# Patient Record
Sex: Male | Born: 1960 | Race: White | Hispanic: No | Marital: Married | Smoking: Never smoker
Health system: Southern US, Community
[De-identification: ages and names within clinical notes are randomized; demographics above are authoritative.]

## PROBLEM LIST (undated history)

## (undated) DIAGNOSIS — Z789 Other specified health status: Secondary | ICD-10-CM

## (undated) HISTORY — PX: OTHER SURGICAL HISTORY: SHX169

## (undated) HISTORY — PX: HIP SURGERY: SHX245

---

## 2011-07-05 ENCOUNTER — Emergency Department (HOSPITAL_COMMUNITY)
Admission: EM | Admit: 2011-07-05 | Discharge: 2011-07-06 | Disposition: A | Discharge status: Home / Self Care | Payer: PRIVATE HEALTH INSURANCE | Attending: Emergency Medicine | Admitting: Emergency Medicine

## 2011-07-05 ENCOUNTER — Emergency Department (HOSPITAL_COMMUNITY): Payer: PRIVATE HEALTH INSURANCE

## 2011-07-05 ENCOUNTER — Encounter (HOSPITAL_COMMUNITY): Payer: Self-pay | Admitting: *Deleted

## 2011-07-05 DIAGNOSIS — M79609 Pain in unspecified limb: Secondary | ICD-10-CM | POA: Insufficient documentation

## 2011-07-05 DIAGNOSIS — R0789 Other chest pain: Secondary | ICD-10-CM | POA: Insufficient documentation

## 2011-07-05 LAB — BASIC METABOLIC PANEL
BUN: 10 mg/dL (ref 6–23)
CO2: 23 mEq/L (ref 19–32)
Chloride: 101 mEq/L (ref 96–112)
Creatinine, Ser: 0.83 mg/dL (ref 0.50–1.35)
GFR calc Af Amer: >90 mL/min (ref >90)
Potassium: 3.8 mEq/L (ref 3.5–5.1)

## 2011-07-05 LAB — DIFFERENTIAL
Basophils Absolute: 0 10*3/uL (ref 0.0–0.1)
Basophils Relative: 1 % (ref 0–1)
Lymphocytes Relative: 30 % (ref 12–46)
Monocytes Absolute: 0.5 10*3/uL (ref 0.1–1.0)
Monocytes Relative: 8 % (ref 3–12)
Neutro Abs: 3.2 10*3/uL (ref 1.7–7.7)
Neutrophils Relative %: 56 % (ref 43–77)

## 2011-07-05 LAB — CARDIAC PANEL(CRET KIN+CKTOT+MB+TROPI)
Relative Index: 1.6 (ref 0.0–2.5)
Relative Index: 1.6 (ref 0.0–2.5)
Total CK: 159 U/L (ref 7–232)
Total CK: 155 U/L (ref 7–232)
Troponin I: <0.3 ng/mL (ref <0.30)

## 2011-07-05 LAB — CBC
HCT: 39.2 % (ref 39.0–52.0)
Hemoglobin: 13.5 g/dL (ref 13.0–17.0)
WBC: 5.8 10*3/uL (ref 4.0–10.5)

## 2011-07-05 MED ORDER — ASPIRIN 81 MG PO CHEW
324.0000 mg | CHEWABLE_TABLET | Freq: Once | ORAL | Status: AC
  Administered 2011-07-05: 324 mg via ORAL
  Filled 2011-07-05: qty 4

## 2011-07-05 MED ORDER — GI COCKTAIL ~~LOC~~
30.0000 mL | Freq: Once | ORAL | Status: AC
  Administered 2011-07-05: 30 mL via ORAL
  Filled 2011-07-05: qty 30

## 2011-07-05 MED ORDER — OMEPRAZOLE 20 MG PO CPDR: 20.0000 mg | DELAYED_RELEASE_CAPSULE | Freq: Every day | ORAL | Status: DC

## 2011-07-05 NOTE — ED Notes (Signed)
MD at bedside. 

## 2011-07-05 NOTE — ED Provider Notes (Signed)
History   This chart was scribed for Glynn Octave, MD by Charolett Bumpers . The patient was seen in room APA09/APA09.    CSN: [id number]  Arrival date & time 07/05/11  2123   First MD Initiated Contact with Patient 07/05/11 2132      Chief Complaint  Patient presents with  . Chest Pain    (Consider location/radiation/quality/duration/timing/severity/associated sxs/prior treatment) HPI Austin Bon. is a 51 y.o. male who presents to the Emergency Department complaining of intermittent, moderate chest pain for the past week with it worsening today. Patient states that the pain is located on the left side with associated right arm pain. Patient states that the chest pain is achy, lasting 20-30 minutes and sometimes it lasts 5 minutes. Patient states that nothing specific brings on the pain. Patient denies any nausea, SOB or diaphoresis. Patient states that he has had increased stress recently. Patient denies any radiation into back. Patient denies taking any medications. Patient denies having a PCP. Patient denies any h/o similar symptoms. Patient states that he had a stress test 5 years ago, normal. Patient denies smoking. Nothing makes the symptoms better or worse. Patient states that currently his chest is achy. Patient states that he took an aspirin PTA.    History reviewed. No pertinent past medical history.  Past Surgical History  Procedure Date  . Hip surgery     History reviewed. No pertinent family history.  History  Substance Use Topics  . Smoking status: Never Smoker   . Smokeless tobacco: Not on file  . Alcohol Use: 2.4 oz/week    4 Cans of beer per week     occasional      Review of Systems  Respiratory: Negative for shortness of breath.   Cardiovascular: Positive for chest pain.  Gastrointestinal: Negative for nausea, vomiting and abdominal pain.  Musculoskeletal: Negative for back pain.  Neurological: Negative for headaches.  All other systems  reviewed and are negative.    Allergies  Review of patient's allergies indicates no known allergies.  Home Medications   Current Outpatient Rx  Name Route Sig Dispense Refill  . ASPIRIN EC 81 MG PO TBEC Oral Take 81 mg by mouth once.    . IBUPROFEN 200 MG PO TABS Oral Take 400 mg by mouth once as needed. For pain      BP 131/82  Pulse 78  Temp(Src) 97.6 F (36.4 C) (Oral)  Resp 16  Ht 5' 11.5" (1.816 m)  Wt 206 lb (93.441 kg)  BMI 28.33 kg/m2  SpO2 100%  Physical Exam  Nursing note and vitals reviewed. Constitutional: He is oriented to person, place, and time. He appears well-developed and well-nourished. No distress.  HENT:  Head: Normocephalic and atraumatic.  Eyes: EOM are normal. Pupils are equal, round, and reactive to light.  Neck: Normal range of motion. Neck supple. No tracheal deviation present.  Cardiovascular: Normal rate, regular rhythm and normal heart sounds.   Pulmonary/Chest: Effort normal and breath sounds normal. No respiratory distress.  Abdominal: Soft. He exhibits no distension. There is no tenderness.  Musculoskeletal: Normal range of motion. He exhibits no edema.  Neurological: He is alert and oriented to person, place, and time. No sensory deficit.  Skin: Skin is warm and dry.  Psychiatric: He has a normal mood and affect. His behavior is normal.    ED Course  Procedures (including critical care time)  DIAGNOSTIC STUDIES: Oxygen Saturation is 100% on room air, normal by my interpretation.  COORDINATION OF CARE:  2147: Discussed planned course of treatment with the patient, who is agreeable at this time. Discussed f/u with a PCP for a stress test. Discussed the unlikelihood of MI due to lack of risk factors.  2200: Medication Orders: Aspirin chewable tablet 324 mg-once.  2357: Recheck: Informed patient of lab and imaging results. Discussed f/u with cardiologist. Will check additional labs and will d/c. Patient agreeable at this time.      Labs Reviewed  DIFFERENTIAL - Abnormal; Notable for the following:    Eosinophils Relative 6 (*)    All other components within normal limits  BASIC METABOLIC PANEL - Abnormal; Notable for the following:    Glucose, Bld 109 (*)    All other components within normal limits  CBC  CARDIAC PANEL(CRET KIN+CKTOT+MB+TROPI)   Dg Chest 2 View  07/05/2011  *RADIOLOGY REPORT*  Clinical Data: Chest pain  CHEST - 2 VIEW  Comparison: None.  Findings: Hyperinflation.  Mild interstitial prominence.  The apices are partially obscured by chondral calcification.  Within this limitation, no focal consolidation, pleural effusion, pneumothorax.  Mild vascular fullness. Cardiomediastinal contours otherwise within normal limits.  No acute osseous finding.  IMPRESSION: Hyperinflation without focal consolidation.  Original Report Authenticated By: Waneta Martins, M.D.     No diagnosis found.    MDM  Intermittent chest pains for the past week it seemed to be associated with stress. Sometimes lasting minutes to hours. No pain currently. No shortness of breath, nausea, diaphoresis. No cardiac history.  EKG normal. Troponin negative. Atypical for ACS.  Patient states he knows a cardiologist in [place].  Will also give referral to Dr. Dietrich Pates. TIMI 0. Return precautions discussed.   Date: 07/05/2011  Rate: 66  Rhythm: normal sinus rhythm  QRS Axis: normal  Intervals: normal  ST/T Wave abnormalities: normal  Conduction Disutrbances:none  Narrative Interpretation:   Old EKG Reviewed: none available    I personally performed the services described in this documentation, which was scribed in my presence.  The recorded information has been reviewed and considered.        Glynn Octave, MD 07/06/11 0000

## 2011-07-05 NOTE — ED Notes (Signed)
Pt reports intermitent chest pains starting a week ago.  Reports stress increases pain. Denies SOB or nausea.   No distress noted in triage.

## 2011-07-05 NOTE — Discharge Instructions (Signed)
Chest Pain (Nonspecific) There is no evidence tonight of a heart attack or blood clot in the lung.  However, you need to follow up with your doctor for a stress test.  Return to the ED if you develop new or worsening symptoms. It is often hard to give a specific diagnosis for the cause of chest pain. There is always a chance that your pain could be related to something serious, such as a heart attack or a blood clot in the lungs. You need to follow up with your caregiver for further evaluation. CAUSES   Heartburn.   Pneumonia or bronchitis.   Anxiety or stress.   Inflammation around your heart (pericarditis) or lung (pleuritis or pleurisy).   A blood clot in the lung.   A collapsed lung (pneumothorax). It can develop suddenly on its own (spontaneous pneumothorax) or from injury (trauma) to the chest.   Shingles infection (herpes zoster virus).  The chest wall is composed of bones, muscles, and cartilage. Any of these can be the source of the pain.  The bones can be bruised by injury.   The muscles or cartilage can be strained by coughing or overwork.   The cartilage can be affected by inflammation and become sore (costochondritis).  DIAGNOSIS  Lab tests or other studies, such as X-rays, electrocardiography, stress testing, or cardiac imaging, may be needed to find the cause of your pain.  TREATMENT   Treatment depends on what may be causing your chest pain. Treatment may include:   Acid blockers for heartburn.   Anti-inflammatory medicine.   Pain medicine for inflammatory conditions.   Antibiotics if an infection is present.   You may be advised to change lifestyle habits. This includes stopping smoking and avoiding alcohol, caffeine, and chocolate.   You may be advised to keep your head raised (elevated) when sleeping. This reduces the chance of acid going backward from your stomach into your esophagus.   Most of the time, nonspecific chest pain will improve within 2 to 3  days with rest and mild pain medicine.  HOME CARE INSTRUCTIONS   If antibiotics were prescribed, take your antibiotics as directed. Finish them even if you start to feel better.   For the next few days, avoid physical activities that bring on chest pain. Continue physical activities as directed.   Do not smoke.   Avoid drinking alcohol.   Only take over-the-counter or prescription medicine for pain, discomfort, or fever as directed by your caregiver.   Follow your caregiver's suggestions for further testing if your chest pain does not go away.   Keep any follow-up appointments you made. If you do not go to an appointment, you could develop lasting (chronic) problems with pain. If there is any problem keeping an appointment, you must call to reschedule.  SEEK MEDICAL CARE IF:   You think you are having problems from the medicine you are taking. Read your medicine instructions carefully.   Your chest pain does not go away, even after treatment.   You develop a rash with blisters on your chest.  SEEK IMMEDIATE MEDICAL CARE IF:   You have increased chest pain or pain that spreads to your arm, neck, jaw, back, or abdomen.   You develop shortness of breath, an increasing cough, or you are coughing up blood.   You have severe back or abdominal pain, feel nauseous, or vomit.   You develop severe weakness, fainting, or chills.   You have a fever.  THIS IS AN  EMERGENCY. Do not wait to see if the pain will go away. Get medical help at once. Call your local emergency services (911 in [place] Do not drive yourself to the hospital. MAKE SURE YOU:   Understand these instructions.   Will watch your condition.   Will get help right away if you are not doing well or get worse.  Document Released: 10/24/2004 Document Revised: 01/03/2011 Document Reviewed: 08/20/2007 [facility] Patient Information 2012 [place], [place].

## 2011-07-06 NOTE — ED Notes (Signed)
Pt alert & oriented x4, stable gait. Pt given discharge instructions, paperwork & prescription(s). Patient instructed to stop at the registration desk to finish any additional paperwork. pt verbalized understanding. Pt left department w/ no further questions.  

## 2011-08-06 ENCOUNTER — Encounter (INDEPENDENT_AMBULATORY_CARE_PROVIDER_SITE_OTHER): Payer: Self-pay | Admitting: *Deleted

## 2013-05-20 ENCOUNTER — Encounter (INDEPENDENT_AMBULATORY_CARE_PROVIDER_SITE_OTHER): Payer: Self-pay | Admitting: *Deleted

## 2013-05-26 ENCOUNTER — Other Ambulatory Visit (INDEPENDENT_AMBULATORY_CARE_PROVIDER_SITE_OTHER): Payer: Self-pay | Admitting: *Deleted

## 2013-05-26 ENCOUNTER — Encounter (INDEPENDENT_AMBULATORY_CARE_PROVIDER_SITE_OTHER): Payer: Self-pay | Admitting: *Deleted

## 2013-05-26 ENCOUNTER — Telehealth (INDEPENDENT_AMBULATORY_CARE_PROVIDER_SITE_OTHER): Payer: Self-pay | Admitting: *Deleted

## 2013-05-26 DIAGNOSIS — Z1211 Encounter for screening for malignant neoplasm of colon: Secondary | ICD-10-CM

## 2013-05-26 NOTE — Telephone Encounter (Signed)
Patient needs movi prep 

## 2013-05-28 MED ORDER — PEG-KCL-NACL-NASULF-NA ASC-C 100 G PO SOLR: 1.0000 | Freq: Once | ORAL | Status: DC

## 2013-07-16 ENCOUNTER — Telehealth (INDEPENDENT_AMBULATORY_CARE_PROVIDER_SITE_OTHER): Payer: Self-pay | Admitting: *Deleted

## 2013-07-16 NOTE — Telephone Encounter (Signed)
  Procedure: tcs  Reason/Indication:  screening  Has patient had this procedure before?  no  If so, when, by whom and where?    Is there a family history of colon cancer?  no  Who?  What age when diagnosed?    Is patient diabetic?   no      Does patient have prosthetic heart valve?  no  Do you have a pacemaker?  no  Has patient ever had endocarditis? no  Has patient had joint replacement within last 12 months?  no  Does patient tend to be constipated or take laxatives? no  Is patient on Coumadin, Plavix and/or Aspirin? no  Medications: none  Allergies: nkda  Medication Adjustment:   Procedure date & time: 08/05/13 at 1030

## 2013-07-19 ENCOUNTER — Encounter (HOSPITAL_COMMUNITY): Payer: Self-pay | Admitting: Pharmacy Technician

## 2013-07-20 NOTE — Telephone Encounter (Signed)
agree

## 2013-08-05 ENCOUNTER — Encounter (HOSPITAL_COMMUNITY)
Admission: RE | Disposition: A | Discharge status: Home / Self Care | Payer: Self-pay | Source: Ambulatory Visit | Attending: Internal Medicine

## 2013-08-05 ENCOUNTER — Ambulatory Visit (HOSPITAL_COMMUNITY)
Admission: RE | Admit: 2013-08-05 | Discharge: 2013-08-05 | Disposition: A | Discharge status: Home / Self Care | Payer: PRIVATE HEALTH INSURANCE | Source: Ambulatory Visit | Attending: Internal Medicine | Admitting: Internal Medicine

## 2013-08-05 ENCOUNTER — Encounter (HOSPITAL_COMMUNITY): Payer: Self-pay | Admitting: *Deleted

## 2013-08-05 DIAGNOSIS — D126 Benign neoplasm of colon, unspecified: Secondary | ICD-10-CM | POA: Insufficient documentation

## 2013-08-05 DIAGNOSIS — Z1211 Encounter for screening for malignant neoplasm of colon: Secondary | ICD-10-CM | POA: Insufficient documentation

## 2013-08-05 HISTORY — PX: COLONOSCOPY: SHX5424

## 2013-08-05 HISTORY — PX: POLYPECTOMY: SHX5525

## 2013-08-05 HISTORY — DX: Other specified health status: Z78.9

## 2013-08-05 SURGERY — COLONOSCOPY
Anesthesia: Moderate Sedation

## 2013-08-05 MED ORDER — STERILE WATER FOR IRRIGATION IR SOLN
Status: DC | PRN
  Administered 2013-08-05: 10:00:00

## 2013-08-05 MED ORDER — MIDAZOLAM HCL 5 MG/5ML IJ SOLN
INTRAMUSCULAR | Status: AC
  Filled 2013-08-05: qty 10

## 2013-08-05 MED ORDER — MIDAZOLAM HCL 5 MG/5ML IJ SOLN
INTRAMUSCULAR | Status: DC | PRN
  Administered 2013-08-05 (×4): 2 mg via INTRAVENOUS

## 2013-08-05 MED ORDER — MEPERIDINE HCL 50 MG/ML IJ SOLN
INTRAMUSCULAR | Status: DC | PRN
  Administered 2013-08-05 (×2): 25 mg via INTRAVENOUS

## 2013-08-05 MED ORDER — MEPERIDINE HCL 50 MG/ML IJ SOLN
INTRAMUSCULAR | Status: AC
  Filled 2013-08-05: qty 1

## 2013-08-05 MED ORDER — SODIUM CHLORIDE 0.9 % IV SOLN
INTRAVENOUS | Status: DC
  Administered 2013-08-05: 10:00:00 via INTRAVENOUS

## 2013-08-05 NOTE — Discharge Instructions (Signed)
Resume usual diet. No driving for 24 hours. Physician will call with biopsy results    Colon Polyps Polyps are lumps of extra tissue growing inside the body. Polyps can grow in the large intestine (colon). Most colon polyps are noncancerous (benign). However, some colon polyps can become cancerous over time. Polyps that are larger than a pea may be harmful. To be safe, caregivers remove and test all polyps. CAUSES  Polyps form when mutations in the genes cause your cells to grow and divide even though no more tissue is needed. RISK FACTORS There are a number of risk factors that can increase your chances of getting colon polyps. They include:  Being older than 50 years.  Family history of colon polyps or colon cancer.  Long-term colon diseases, such as colitis or Crohn disease.  Being overweight.  Smoking.  Being inactive.  Drinking too much alcohol. SYMPTOMS  Most small polyps do not cause symptoms. If symptoms are present, they may include:  Blood in the stool. The stool may look dark red or black.  Constipation or diarrhea that lasts longer than 1 week. DIAGNOSIS People often do not know they have polyps until their caregiver finds them during a regular checkup. Your caregiver can use 4 tests to check for polyps:  Digital rectal exam. The caregiver wears gloves and feels inside the rectum. This test would find polyps only in the rectum.  Barium enema. The caregiver puts a liquid called barium into your rectum before taking X-rays of your colon. Barium makes your colon look white. Polyps are dark, so they are easy to see in the X-ray pictures.  Sigmoidoscopy. A thin, flexible tube (sigmoidoscope) is placed into your rectum. The sigmoidoscope has a light and tiny camera in it. The caregiver uses the sigmoidoscope to look at the last third of your colon.  Colonoscopy. This test is like sigmoidoscopy, but the caregiver looks at the entire colon. This is the most common method  for finding and removing polyps. TREATMENT  Any polyps will be removed during a sigmoidoscopy or colonoscopy. The polyps are then tested for cancer. PREVENTION  To help lower your risk of getting more colon polyps:  Eat plenty of fruits and vegetables. Avoid eating fatty foods.  Do not smoke.  Avoid drinking alcohol.  Exercise every day.  Lose weight if recommended by your caregiver.  Eat plenty of calcium and folate. Foods that are rich in calcium include milk, cheese, and broccoli. Foods that are rich in folate include chickpeas, kidney beans, and spinach. HOME CARE INSTRUCTIONS Keep all follow-up appointments as directed by your caregiver. You may need periodic exams to check for polyps. SEEK MEDICAL CARE IF: You notice bleeding during a bowel movement. Document Released: 10/11/2003 Document Revised: 04/08/2011 Document Reviewed: 03/26/2011 [facility] Patient Information 2015 [place], [place]. This information is not intended to replace advice given to you by your health care provider. Make sure you discuss any questions you have with your health care provider. Colonoscopy, Care After These instructions give you information on caring for yourself after your procedure. Your doctor may also give you more specific instructions. Call your doctor if you have any problems or questions after your procedure. HOME CARE  Do not drive for 24 hours.  Do not sign important papers or use machinery for 24 hours.  You may shower.  You may go back to your usual activities, but go slower for the first 24 hours.  Take rest breaks often during the first 24 hours.  Walk around or use warm packs on your belly (abdomen) if you have belly cramping or gas.  Drink enough fluids to keep your pee (urine) clear or pale yellow.  Resume your normal diet. Avoid heavy or fried foods.  Avoid drinking alcohol for 24 hours or as told by your doctor.  Only take medicines as told by your doctor. If a tissue  sample (biopsy) was taken during the procedure:   Do not take aspirin or blood thinners for 7 days, or as told by your doctor.  Do not drink alcohol for 7 days, or as told by your doctor.  Eat soft foods for the first 24 hours. GET HELP IF: You still have a small amount of blood in your poop (stool) 2-3 days after the procedure. GET HELP RIGHT AWAY IF:  You have more than a small amount of blood in your poop.  You see clumps of tissue (blood clots) in your poop.  Your belly is puffy (swollen).  You feel sick to your stomach (nauseous) or throw up (vomit).  You have a fever.  You have belly pain that gets worse and medicine does not help. MAKE SURE YOU:  Understand these instructions.  Will watch your condition.  Will get help right away if you are not doing well or get worse. Document Released: 02/16/2010 Document Revised: 01/19/2013 Document Reviewed: 09/21/2012 [facility] Patient Information 2015 [place], [place]. This information is not intended to replace advice given to you by your health care provider. Make sure you discuss any questions you have with your health care provider.

## 2013-08-05 NOTE — H&P (Signed)
Austin Reeves. is an 53 y.o. male.   Chief Complaint: Patient is here for colonoscopy. HPI: Patient is 53 year old Caucasian male who is here for screening colonoscopy. Austin Reeves denies abdominal pain change in bowel habits or rectal bleeding. Family history is negative for CRC.  Past Medical History  Diagnosis Date  . Medical history non-contributory     Past Surgical History  Procedure Laterality Date  . Hip surgery    . Urinary stent      Family History  Problem Relation Age of Onset  . Lung cancer Father   . Colon cancer Neg Hx    Social History:  reports that Austin Reeves has never smoked. Austin Reeves does not have any smokeless tobacco history on file. Austin Reeves reports that Austin Reeves drinks about 2.4 ounces of alcohol per week. Austin Reeves reports that Austin Reeves does not use illicit drugs.  Allergies: No Known Allergies  No prescriptions prior to admission    No results found for this or any previous visit (from the past 48 hour(s)). No results found.  ROS  Blood pressure 106/45, pulse 63, temperature 97.4 F (36.3 C), temperature source Oral, resp. rate 18, height 6' (1.829 m), weight 192 lb (87.091 kg), SpO2 100.00%. Physical Exam  Constitutional: Austin Reeves is oriented to person, place, and time. Austin Reeves appears well-developed and well-nourished.  HENT:  Mouth/Throat: Oropharynx is clear and moist.  Eyes: Conjunctivae are normal. No scleral icterus.  Neck: No thyromegaly present.  Cardiovascular: Normal rate, regular rhythm and normal heart sounds.   No murmur heard. Respiratory: Effort normal and breath sounds normal.  GI: Soft. Bowel sounds are normal. Austin Reeves exhibits no distension and no mass. There is no tenderness.  Musculoskeletal: Austin Reeves exhibits no edema.  Lymphadenopathy:    Austin Reeves has no cervical adenopathy.  Neurological: Austin Reeves is alert and oriented to person, place, and time.  Skin: Skin is warm and dry.     Assessment/Plan Average risk screening colonoscopy.  Aikam Hellickson U 08/05/2013, 10:07 AM

## 2013-08-05 NOTE — Op Note (Addendum)
COLONOSCOPY PROCEDURE REPORT  PATIENT:  Austin Reeves.  MR#:  [id number] Birthdate:  1960-06-05, 53 y.o., male Endoscopist:  Dr. Rogene Houston, MD Referred By:  Dr. Ames Dura, MD Procedure Date: 08/05/2013  Procedure:   Colonoscopy  Indications: Patient is 53 year old Caucasian male who is undergoing average risk screening colonoscopy.  Informed Consent:  The procedure and risks were reviewed with the patient and informed consent was obtained.  Medications:  Demerol 50 mg IV Versed 8 mg IV  Description of procedure:  After a digital rectal exam was performed, that colonoscope was advanced from the anus through the rectum and colon to the area of the cecum, ileocecal valve and appendiceal orifice. The cecum was deeply intubated. These structures were well-seen and photographed for the record. From the level of the cecum and ileocecal valve, the scope was slowly and cautiously withdrawn. The mucosal surfaces were carefully surveyed utilizing scope tip to flexion to facilitate fold flattening as needed. The scope was pulled down into the rectum where a thorough exam including retroflexion was performed.  Findings:   Prep satisfactory. Three small polyps ablated via cold biopsy(2) and cold snare(1) and submitted together. Two were located at splenic flexure and one at sigmoid colon. Mucosa of rest  of the colon and rectum was normal. Anorectal junction was unremarkable.   Therapeutic/Diagnostic Maneuvers Performed:  See above  Complications:  None  Cecal Withdrawal Time:  12 minutes  Impression:  Examination performed to cecum. Three small polyps ablated via cold biopsy(2) and cold snare(1) and submitted together. Two were located at splenic flexure and one at sigmoid colon.  Recommendations:  Standard instructions given. I will contact patient with biopsy results and further recommendations.  REHMAN,NAJEEB U  08/05/2013 10:50 AM  CC: Dr. Ames Dura, MD & Dr. Rayne Du  ref. provider found

## 2013-08-09 ENCOUNTER — Encounter (HOSPITAL_COMMUNITY): Payer: Self-pay | Admitting: Internal Medicine

## 2013-08-10 ENCOUNTER — Encounter (INDEPENDENT_AMBULATORY_CARE_PROVIDER_SITE_OTHER): Payer: Self-pay | Admitting: *Deleted

## 2017-06-13 ENCOUNTER — Encounter: Payer: Self-pay | Admitting: Family Medicine

## 2017-06-13 ENCOUNTER — Ambulatory Visit (INDEPENDENT_AMBULATORY_CARE_PROVIDER_SITE_OTHER): Payer: 59 | Admitting: Family Medicine

## 2017-06-13 ENCOUNTER — Other Ambulatory Visit: Payer: Self-pay

## 2017-06-13 VITALS — BP 116/70 | HR 61 | Temp 98.5°F | Resp 16 | Ht 72.0 in | Wt 205.1 lb

## 2017-06-13 DIAGNOSIS — L989 Disorder of the skin and subcutaneous tissue, unspecified: Secondary | ICD-10-CM | POA: Diagnosis not present

## 2017-06-13 DIAGNOSIS — Z0000 Encounter for general adult medical examination without abnormal findings: Secondary | ICD-10-CM | POA: Diagnosis not present

## 2017-06-13 DIAGNOSIS — E785 Hyperlipidemia, unspecified: Secondary | ICD-10-CM

## 2017-06-13 DIAGNOSIS — M24542 Contracture, left hand: Secondary | ICD-10-CM

## 2017-06-13 DIAGNOSIS — Z23 Encounter for immunization: Secondary | ICD-10-CM | POA: Diagnosis not present

## 2017-06-13 DIAGNOSIS — R739 Hyperglycemia, unspecified: Secondary | ICD-10-CM

## 2017-06-13 DIAGNOSIS — R5382 Chronic fatigue, unspecified: Secondary | ICD-10-CM | POA: Diagnosis not present

## 2017-06-13 DIAGNOSIS — R5383 Other fatigue: Secondary | ICD-10-CM | POA: Diagnosis not present

## 2017-06-13 DIAGNOSIS — Z114 Encounter for screening for human immunodeficiency virus [HIV]: Secondary | ICD-10-CM

## 2017-06-13 DIAGNOSIS — Z1159 Encounter for screening for other viral diseases: Secondary | ICD-10-CM | POA: Diagnosis not present

## 2017-06-13 NOTE — Patient Instructions (Signed)
Shingrix Vaccine-check with [facility].
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
## 2017-06-18 ENCOUNTER — Encounter: Payer: Self-pay | Admitting: Family Medicine

## 2017-06-18 LAB — CBC WITH DIFFERENTIAL/PLATELET
Basophils Absolute: 73 cells/uL (ref 0–200)
Basophils Relative: 1.1 %
EOS PCT: 7.3 %
Eosinophils Absolute: 482 cells/uL (ref 15–500)
HCT: 40.8 % (ref 38.5–50.0)
Hemoglobin: 14 g/dL (ref 13.2–17.1)
Lymphs Abs: 1386 cells/uL (ref 850–3900)
MCH: 30.5 pg (ref 27.0–33.0)
MCHC: 34.3 g/dL (ref 32.0–36.0)
MCV: 88.9 fL (ref 80.0–100.0)
MPV: 10.8 fL (ref 7.5–12.5)
Monocytes Relative: 7.3 %
Neutro Abs: 4178 cells/uL (ref 1500–7800)
Neutrophils Relative %: 63.3 %
PLATELETS: 253 10*3/uL (ref 140–400)
RBC: 4.59 10*6/uL (ref 4.20–5.80)
RDW: 12.5 % (ref 11.0–15.0)
TOTAL LYMPHOCYTE: 21 %
WBC mixed population: 482 cells/uL (ref 200–950)
WBC: 6.6 10*3/uL (ref 3.8–10.8)

## 2017-06-18 LAB — HEMOGLOBIN A1C
Hgb A1c MFr Bld: 5.8 % of total Hgb — ABNORMAL HIGH (ref <5.7)
Mean Plasma Glucose: 120 (calc)
eAG (mmol/L): 6.6 (calc)

## 2017-06-18 LAB — LIPID PANEL
CHOLESTEROL: 228 mg/dL — AB (ref <200)
HDL: 53 mg/dL (ref >40)
LDL Cholesterol (Calc): 153 mg/dL (calc) — ABNORMAL HIGH
Non-HDL Cholesterol (Calc): 175 mg/dL (calc) — ABNORMAL HIGH (ref <130)
Total CHOL/HDL Ratio: 4.3 (calc) (ref <5.0)
Triglycerides: 108 mg/dL (ref <150)

## 2017-06-18 LAB — TEST AUTHORIZATION

## 2017-06-18 LAB — COMPLETE METABOLIC PANEL WITH GFR
AG RATIO: 2.2 (calc) (ref 1.0–2.5)
ALKALINE PHOSPHATASE (APISO): 93 U/L (ref 40–115)
ALT: 15 U/L (ref 9–46)
AST: 15 U/L (ref 10–35)
Albumin: 5 g/dL (ref 3.6–5.1)
BUN: 17 mg/dL (ref 7–25)
CO2: 27 mmol/L (ref 20–32)
Calcium: 10 mg/dL (ref 8.6–10.3)
Chloride: 104 mmol/L (ref 98–110)
Creat: 0.9 mg/dL (ref 0.70–1.33)
GFR, Est African American: 111 mL/min/{1.73_m2} (ref >=60)
GFR, Est Non African American: 96 mL/min/{1.73_m2} (ref >=60)
GLOBULIN: 2.3 g/dL (ref 1.9–3.7)
Glucose, Bld: 108 mg/dL — ABNORMAL HIGH (ref 65–99)
Potassium: 4.7 mmol/L (ref 3.5–5.3)
SODIUM: 139 mmol/L (ref 135–146)
Total Bilirubin: 1.1 mg/dL (ref 0.2–1.2)
Total Protein: 7.3 g/dL (ref 6.1–8.1)

## 2017-06-18 LAB — HIV ANTIBODY (ROUTINE TESTING W REFLEX): HIV 1&2 Ab, 4th Generation: NONREACTIVE

## 2017-06-18 LAB — HEPATITIS C ANTIBODY
Hepatitis C Ab: NONREACTIVE
SIGNAL TO CUT-OFF: 0.02 (ref <1.00)

## 2017-06-18 NOTE — Progress Notes (Signed)
Spoke with Tami Ribas at [facility]. It looks like an HIV test was keyed in instead of the Lipid panel that was ordered. She added the lipid panel and it should be ran today.

## 2017-07-02 ENCOUNTER — Telehealth: Payer: Self-pay | Admitting: Family Medicine

## 2017-07-02 ENCOUNTER — Ambulatory Visit (INDEPENDENT_AMBULATORY_CARE_PROVIDER_SITE_OTHER): Payer: Self-pay | Admitting: Orthopaedic Surgery

## 2017-07-02 ENCOUNTER — Encounter (INDEPENDENT_AMBULATORY_CARE_PROVIDER_SITE_OTHER): Payer: Self-pay | Admitting: Orthopaedic Surgery

## 2017-07-02 VITALS — BP 133/77 | HR 49 | Ht 72.0 in | Wt 205.0 lb

## 2017-07-02 DIAGNOSIS — M79642 Pain in left hand: Secondary | ICD-10-CM

## 2017-07-02 NOTE — Telephone Encounter (Signed)
Cholesterol <200 mg/dL 228High    HDL >40 mg/dL 53   Triglycerides <150 mg/dL 108   LDL Cholesterol (Calc) mg/dL (calc) 153High      Please call and give patient the above lab values.  In addition, please send him them in the mail.  I do not know why his cholesterol values did not get on the lab letter.  He does need to follow-up to discuss his cholesterol.

## 2017-07-02 NOTE — Telephone Encounter (Signed)
Labs mailed per patient request
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
## 2017-07-03 ENCOUNTER — Encounter: Payer: Self-pay | Admitting: Family Medicine

## 2017-07-04 ENCOUNTER — Encounter: Payer: Self-pay | Admitting: Family Medicine

## 2018-06-19 DIAGNOSIS — R7303 Prediabetes: Secondary | ICD-10-CM | POA: Diagnosis not present

## 2018-06-19 DIAGNOSIS — Z0000 Encounter for general adult medical examination without abnormal findings: Secondary | ICD-10-CM | POA: Diagnosis not present

## 2018-06-19 DIAGNOSIS — Z13220 Encounter for screening for lipoid disorders: Secondary | ICD-10-CM | POA: Diagnosis not present

## 2018-06-19 DIAGNOSIS — Z125 Encounter for screening for malignant neoplasm of prostate: Secondary | ICD-10-CM | POA: Diagnosis not present

## 2018-07-14 DIAGNOSIS — L570 Actinic keratosis: Secondary | ICD-10-CM | POA: Diagnosis not present

## 2018-07-14 DIAGNOSIS — L819 Disorder of pigmentation, unspecified: Secondary | ICD-10-CM | POA: Diagnosis not present

## 2018-07-14 DIAGNOSIS — D485 Neoplasm of uncertain behavior of skin: Secondary | ICD-10-CM | POA: Diagnosis not present

## 2018-07-14 DIAGNOSIS — D1801 Hemangioma of skin and subcutaneous tissue: Secondary | ICD-10-CM | POA: Diagnosis not present

## 2018-07-14 DIAGNOSIS — L821 Other seborrheic keratosis: Secondary | ICD-10-CM | POA: Diagnosis not present

## 2019-01-04 DIAGNOSIS — R05 Cough: Secondary | ICD-10-CM | POA: Diagnosis not present

## 2019-01-05 ENCOUNTER — Other Ambulatory Visit: Payer: Self-pay

## 2019-01-05 ENCOUNTER — Other Ambulatory Visit (HOSPITAL_COMMUNITY): Payer: Self-pay | Admitting: Family Medicine

## 2019-01-05 ENCOUNTER — Ambulatory Visit (HOSPITAL_COMMUNITY)
Admission: RE | Admit: 2019-01-05 | Discharge: 2019-01-05 | Disposition: A | Discharge status: Home / Self Care | Payer: BC Managed Care – PPO | Source: Ambulatory Visit | Attending: Family Medicine | Admitting: Family Medicine

## 2019-01-05 DIAGNOSIS — R05 Cough: Secondary | ICD-10-CM | POA: Diagnosis not present

## 2019-01-05 DIAGNOSIS — R059 Cough, unspecified: Secondary | ICD-10-CM

## 2020-06-27 IMAGING — DX DG CHEST 2V
3 series · 3 of 3 positions shown · non-contrast
Comparison: Chest x-ray 07/05/2011.

CLINICAL DATA: 58-year-old male recently diagnosed with [id number].
Mild cough.

EXAM:
CHEST - 2 VIEW

[chest pa (1 of 2)]
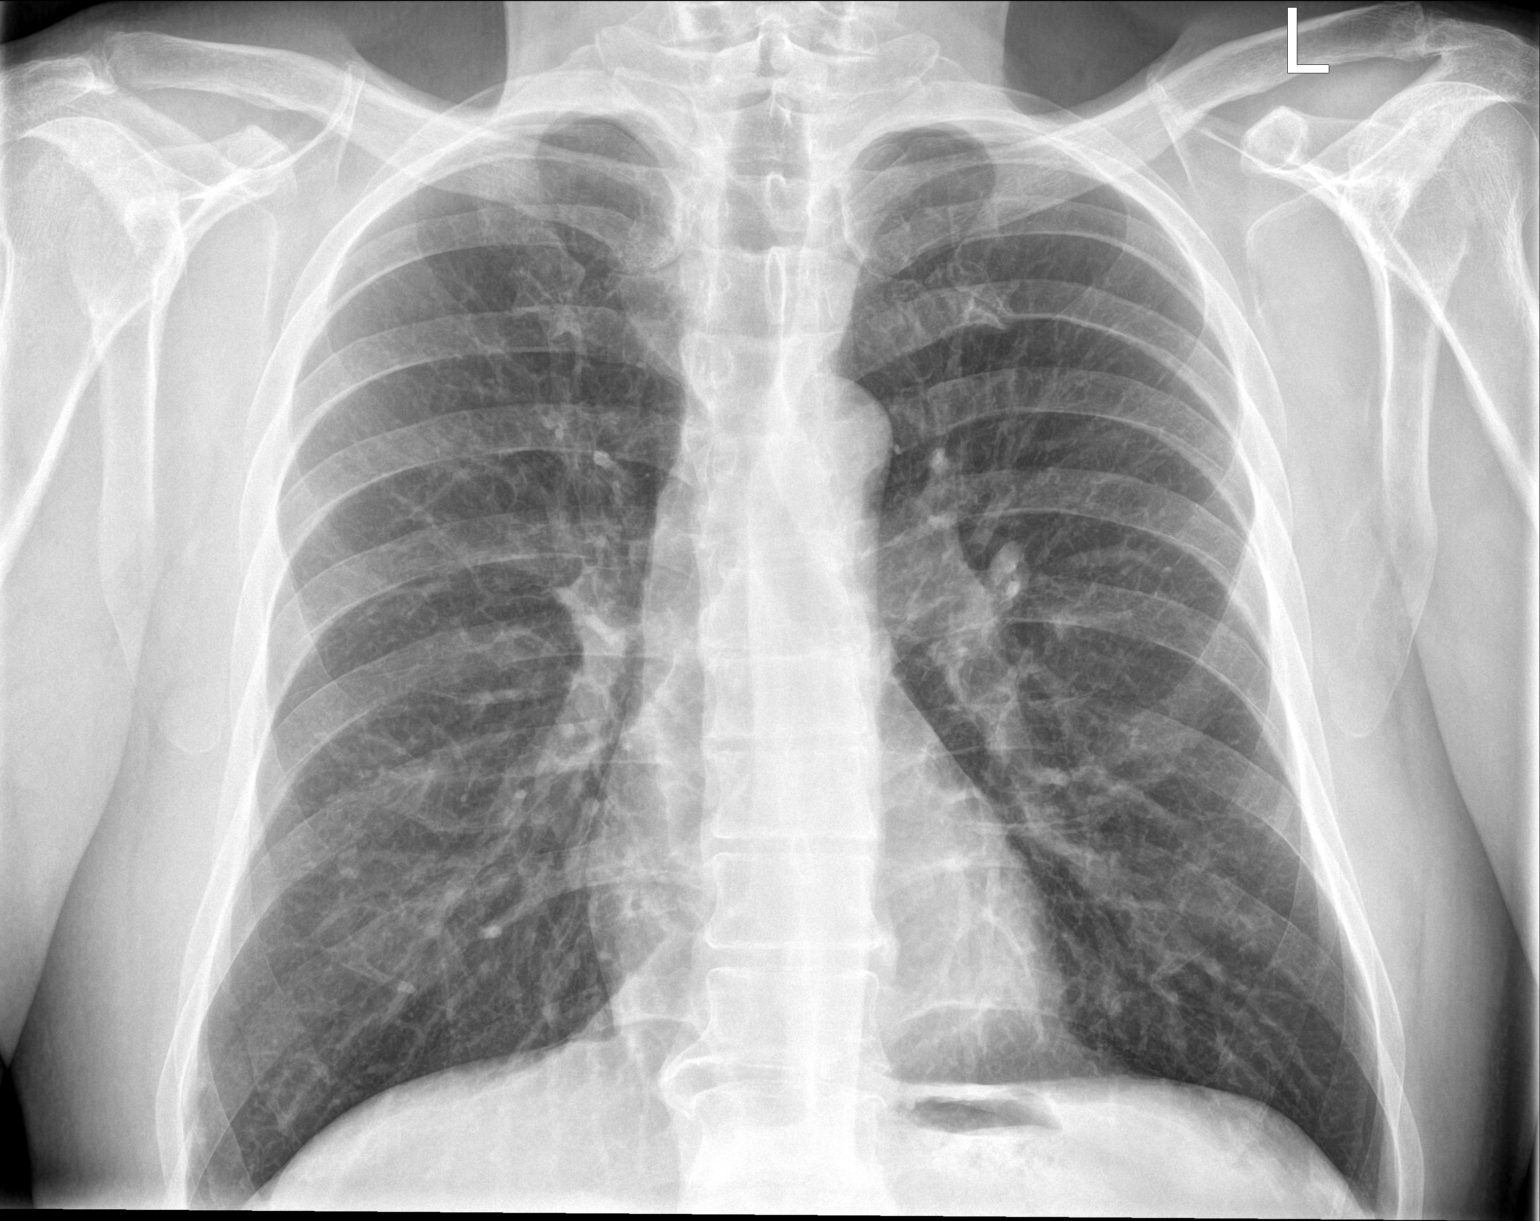

[chest lat]
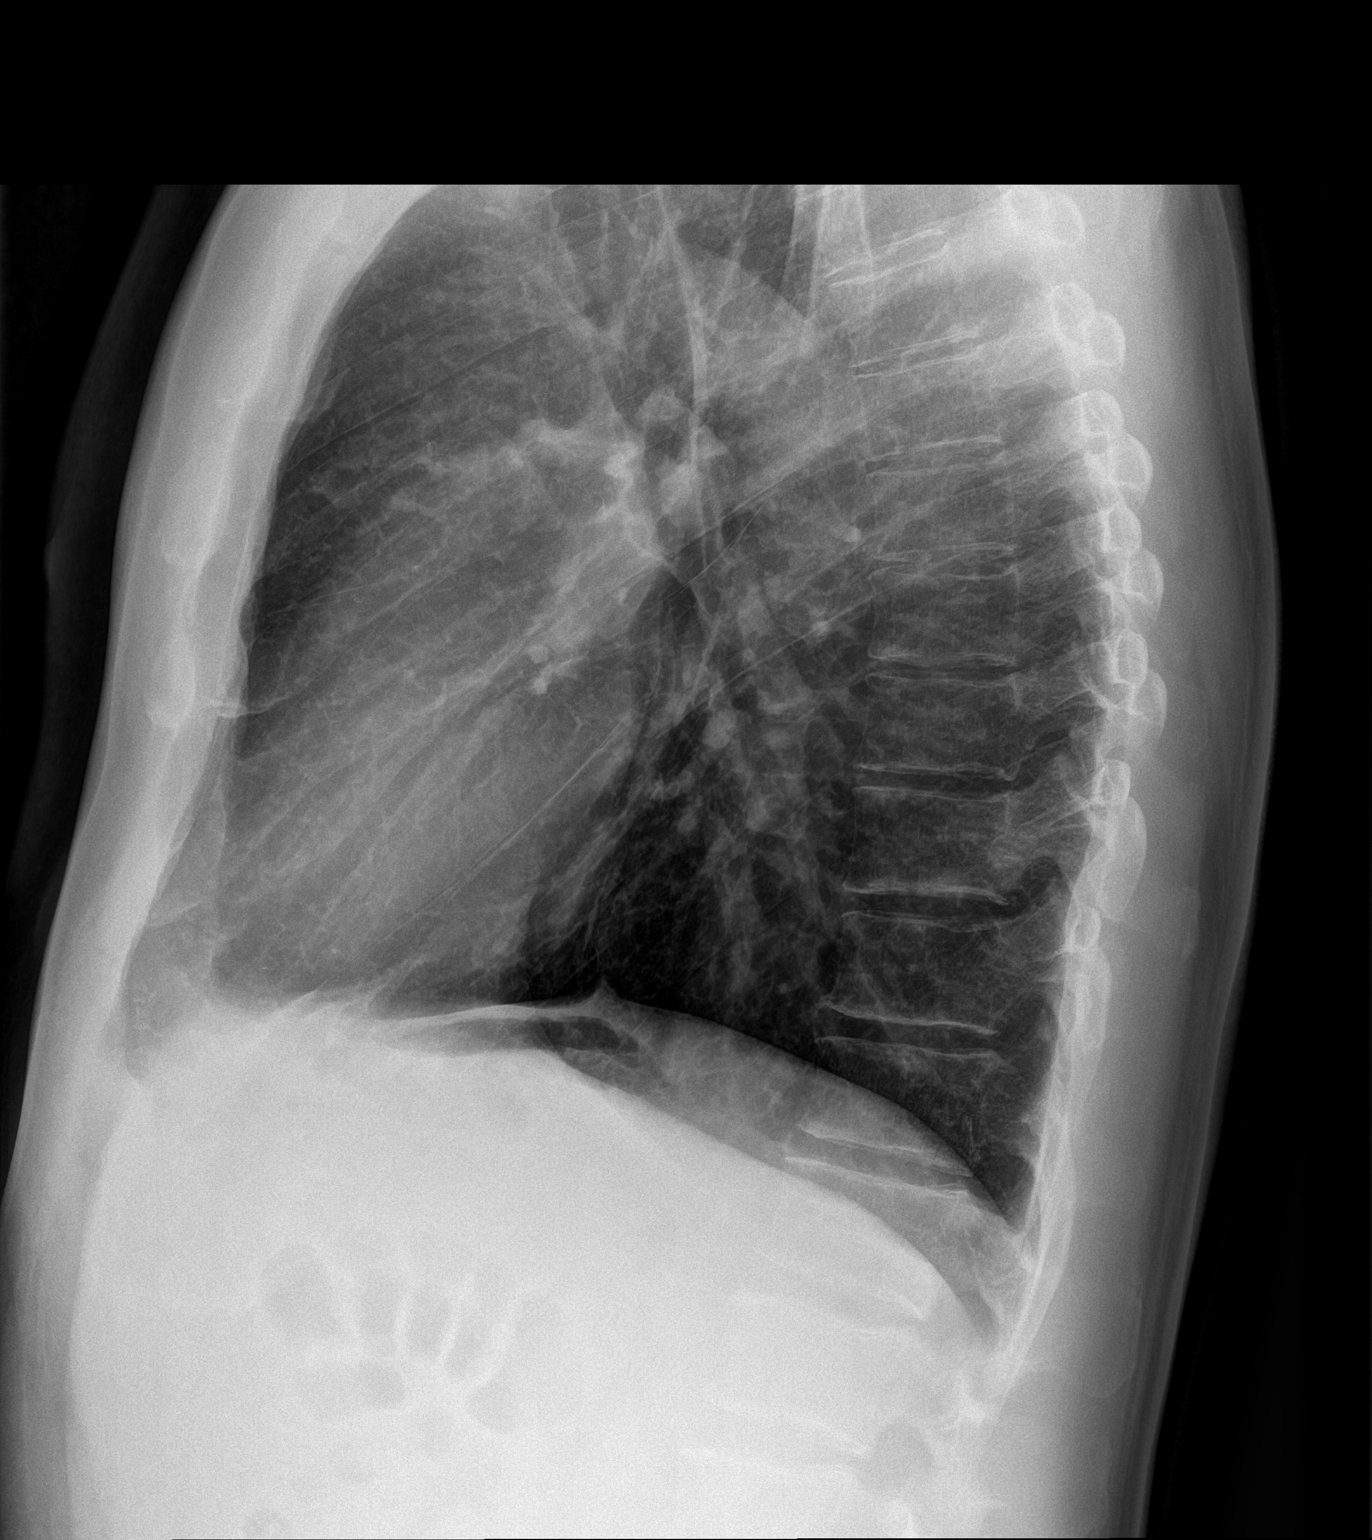

[chest pa (2 of 2)]
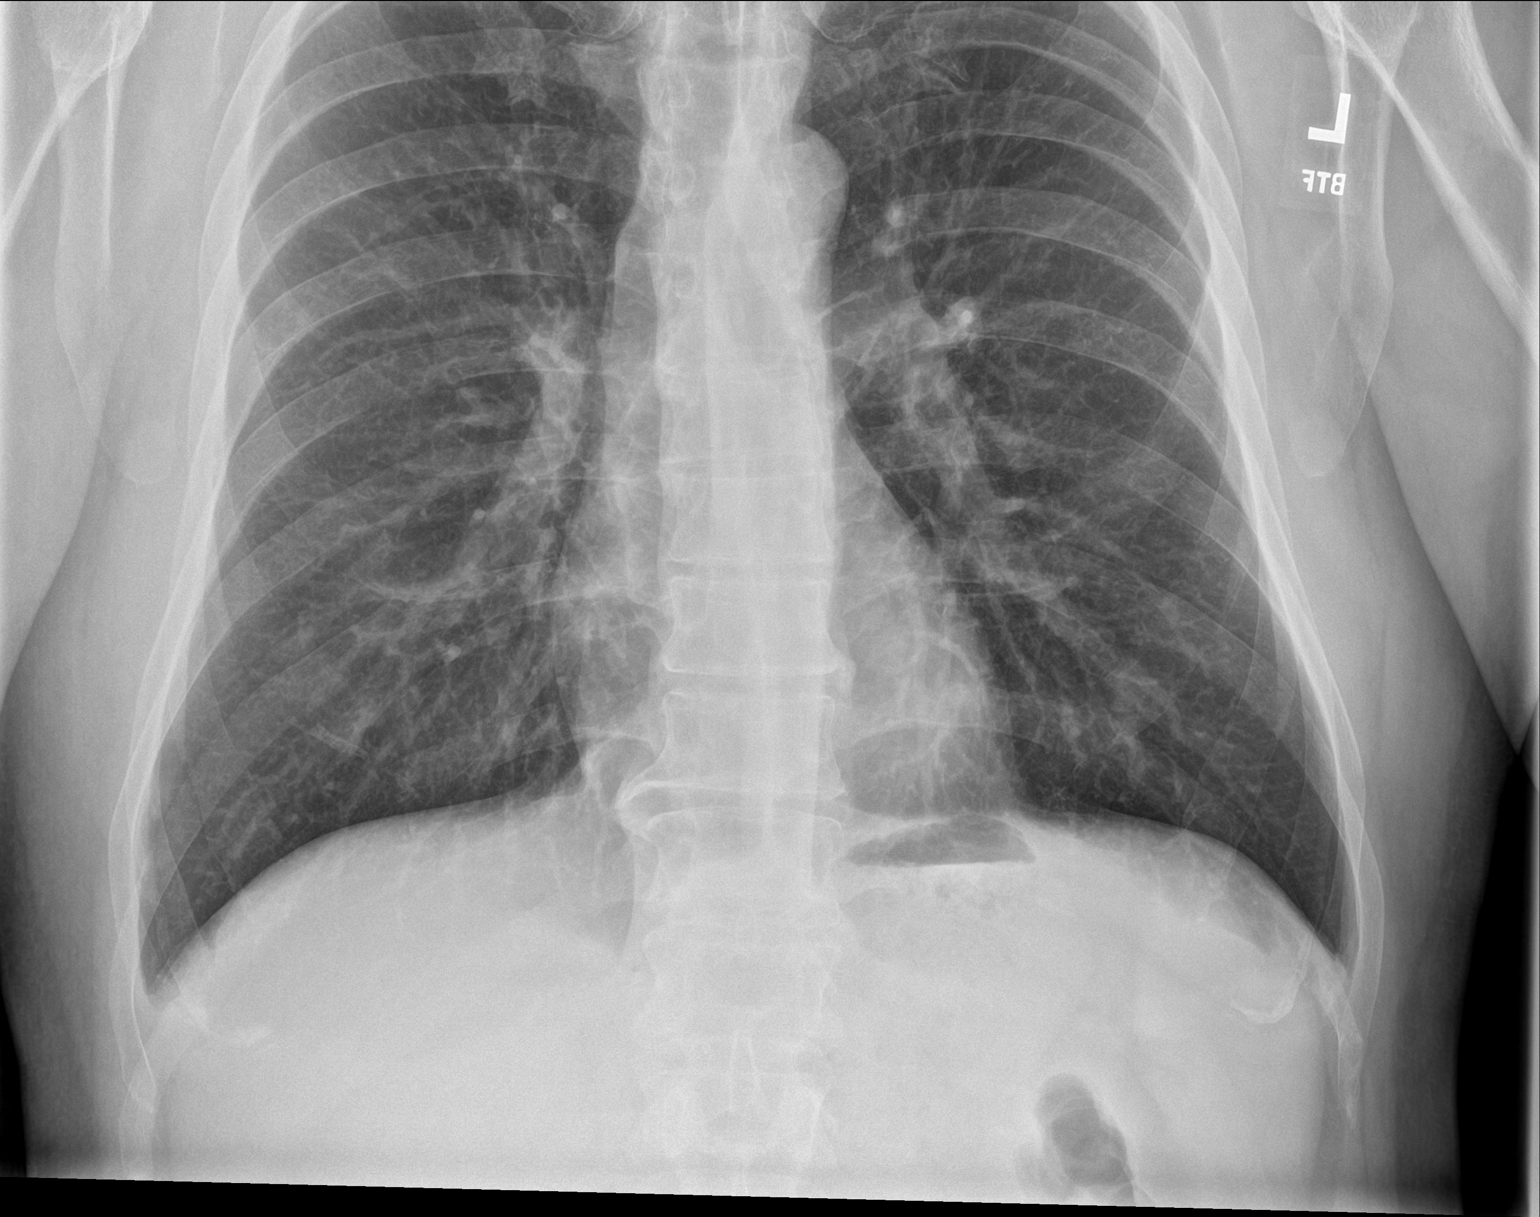

[3 of 3 positions shown; findings below may reference images not displayed]

FINDINGS: Lung volumes are normal. No consolidative airspace disease. No
pleural effusions. No pneumothorax. No pulmonary nodule or mass
noted. Pulmonary vasculature and the cardiomediastinal silhouette
are within normal limits.
IMPRESSION: No radiographic evidence of acute cardiopulmonary disease.

## 2020-07-25 ENCOUNTER — Encounter (INDEPENDENT_AMBULATORY_CARE_PROVIDER_SITE_OTHER): Payer: Self-pay | Admitting: *Deleted

## 2021-01-25 ENCOUNTER — Encounter (HOSPITAL_BASED_OUTPATIENT_CLINIC_OR_DEPARTMENT_OTHER): Payer: Self-pay | Admitting: Nurse Practitioner

## 2021-01-25 ENCOUNTER — Other Ambulatory Visit: Payer: Self-pay

## 2021-01-25 ENCOUNTER — Ambulatory Visit (INDEPENDENT_AMBULATORY_CARE_PROVIDER_SITE_OTHER): Payer: BC Managed Care – PPO | Admitting: Nurse Practitioner

## 2021-01-25 VITALS — BP 122/72 | HR 81 | Ht 72.0 in | Wt 204.5 lb

## 2021-01-25 DIAGNOSIS — Z0000 Encounter for general adult medical examination without abnormal findings: Secondary | ICD-10-CM

## 2021-01-25 DIAGNOSIS — Z130 Encounter for screening for diseases of the blood and blood-forming organs and certain disorders involving the immune mechanism: Secondary | ICD-10-CM

## 2021-01-25 DIAGNOSIS — M720 Palmar fascial fibromatosis [Dupuytren]: Secondary | ICD-10-CM

## 2021-01-25 DIAGNOSIS — R5383 Other fatigue: Secondary | ICD-10-CM | POA: Insufficient documentation

## 2021-01-25 DIAGNOSIS — Z13220 Encounter for screening for lipoid disorders: Secondary | ICD-10-CM

## 2021-01-25 DIAGNOSIS — J309 Allergic rhinitis, unspecified: Secondary | ICD-10-CM | POA: Insufficient documentation

## 2021-01-25 DIAGNOSIS — Z1329 Encounter for screening for other suspected endocrine disorder: Secondary | ICD-10-CM | POA: Diagnosis not present

## 2021-01-25 DIAGNOSIS — Z87898 Personal history of other specified conditions: Secondary | ICD-10-CM | POA: Insufficient documentation

## 2021-01-25 DIAGNOSIS — Z13228 Encounter for screening for other metabolic disorders: Secondary | ICD-10-CM

## 2021-01-25 DIAGNOSIS — Z1321 Encounter for screening for nutritional disorder: Secondary | ICD-10-CM

## 2021-01-25 DIAGNOSIS — J0140 Acute pansinusitis, unspecified: Secondary | ICD-10-CM | POA: Insufficient documentation

## 2021-01-25 MED ORDER — AMOXICILLIN 875 MG PO TABS: 875.0000 mg | ORAL_TABLET | Freq: Two times a day (BID) | ORAL | 0 refills | Status: AC

## 2021-01-25 NOTE — Assessment & Plan Note (Signed)
Series of intermittent palpitations for 1 week approximately one month ago without recurrence.  No symptoms present today No alarm symptoms present.  HRR with no signs of ectopy or irregular beats. No HTN or HLD history.  Unclear etiology at this time, however, encouraged patient to seek evaluation immediately if these symptoms return.  We will obtain labs today for further evaluation of possible electrolyte imbalance or other findings that could be causative.  If reocurrs, will plan for EKG and Zio patch monitoring with cardiology referral for evaluation.

## 2021-01-25 NOTE — Assessment & Plan Note (Signed)
Symptoms consistent with bacterial sinusitis given time present.  He has responded well to amoxicillin in the past, therefore we will begin treatment today with this.  Recommend continued use of OTC medications to help manage symptoms as needed.  If symptoms worsen or persist despite treatment, recommend he contact us for f/u

## 2021-01-25 NOTE — Patient Instructions (Addendum)
Thank you for choosing [place] at [facility] for your Primary Care needs. I am excited for the opportunity to partner with you to meet your health care goals. It was a pleasure meeting you today!  Recommendations from today's visit: Wishing you many blessings and health in the new year! We will get some labs today to make sure everything looks ok.  I would like you to get your flu vaccine, but wait until you feel better from your infection.  I have sent amoxicillin to the pharmacy for you- if you do not feel better after completing this, please let us know.  If I find any additional information on expectation of surgery for  Dupuytren Contraction I will let you know.   Information on diet, exercise, and health maintenance recommendations are listed below. This is information to help you be sure you are on track for optimal health and monitoring.   Please look over this and let us know if you have any questions or if you have completed any of the health maintenance outside of [place] so that we can be sure your records are up to date.  ___________________________________________________________ About Me: I am an Adult-Geriatric Nurse Practitioner with a background in caring for patients for more than 20 years with a strong intensive care background. I provide primary care and sports medicine services to patients age 86 and older within this office. My education had a strong focus on caring for the older adult population, which I am passionate about. I am also the director of the APP Fellowship with [facility].   My desire is to provide you with the best service through preventive medicine and supportive care. I consider you a part of the medical team and value your input. I work diligently to ensure that you are heard and your needs are met in a safe and effective manner. I want you to feel comfortable with me as your provider and want you to know that your health  concerns are important to me.  For your information, our office hours are: Monday, Tuesday, and Thursday 8:00 AM - 5:00 PM Wednesday and Friday 8:00 AM - 12:00 PM.   In my time away from the office I am teaching new APP's within the system and am unavailable, but my partner, Dr. Burnard Bunting is in the office for emergent needs.   If you have questions or concerns, please call our office at [phone] or send [place] a MyChart message and we will respond as quickly as possible.  ____________________________________________________________ MyChart:  For all urgent or time sensitive needs we ask that you please call the office to avoid delays. Our number is [phone]. MyChart is not constantly monitored and due to the large volume of messages a day, replies may take up to 72 business hours.  MyChart Policy: MyChart allows for you to see your visit notes, after visit summary, provider recommendations, lab and tests results, make an appointment, request refills, and contact your provider or the office for non-urgent questions or concerns. Providers are seeing patients during normal business hours and do not have built in time to review MyChart messages.  We ask that you allow a minimum of 3 business days for responses to [employer]. For this reason, please do not send urgent requests through [place]. Please call the office at [phone]. New and ongoing conditions may require a visit. We have virtual and in person visit available for your convenience.  Complex MyChart concerns may require  a visit. Your provider may request you schedule a virtual or in person visit to ensure we are providing the best care possible. MyChart messages sent after 11:00 AM on Friday will not be received by the provider until Monday morning.    Lab and Test Results: You will receive your lab and test results on MyChart as soon as they are completed and results have been sent by the lab or testing facility. Due to this  service, you will receive your results BEFORE your provider.  I review lab and tests results each morning prior to seeing patients. Some results require collaboration with other providers to ensure you are receiving the most appropriate care. For this reason, we ask that you please allow a minimum of 3-5 business days from the time the ALL results have been received for your provider to receive and review lab and test results and contact you about these.  Most lab and test result comments from the provider will be sent through [place]. Your provider may recommend changes to the plan of care, follow-up visits, repeat testing, ask questions, or request an office visit to discuss these results. You may reply directly to this message or call the office at [phone] to provide information for the provider or set up an appointment. In some instances, you will be called with test results and recommendations. Please let us know if this is preferred and we will make note of this in your chart to provide this for you.    If you have not heard a response to your lab or test results in 5 business days from all results returning to [place], please call the office to let us know. We ask that you please avoid calling prior to this time unless there is an emergent concern. Due to high call volumes, this can delay the resulting process.  After Hours: For all non-emergency after hours needs, please call the office at [phone] and select the option to reach the on-call provider service. On-call services are shared between multiple [facility] offices and therefore it will not be possible to speak directly with your provider. On-call providers may provide medical advice and recommendations, but are unable to provide refills for maintenance medications.  For all emergency or urgent medical needs after normal business hours, we recommend that you seek care at the closest Urgent Care or Emergency Department to ensure  appropriate treatment in a timely manner.  [facility] has a 24 hour emergency room located on the ground floor for your convenience.   Urgent Concerns During the Business Day Providers are seeing patients from 8AM to [place] with a busy schedule and are most often not able to respond to non-urgent calls until the end of the day or the next business day. If you should have URGENT concerns during the day, please call and speak to the nurse or schedule a same day appointment so that we can address your concern without delay.   Thank you, again, for choosing me as your health care partner. I appreciate your trust and look forward to learning more about you.   Worthy Keeler, DNP, AGNP-c ___________________________________________________________  Health Maintenance Recommendations Screening Testing Mammogram Every 1 -2 years based on history and risk factors Starting at age 60 Pap Smear Ages 21-39 every 3 years Ages 52-65 every 5 years with HPV testing More frequent testing may be required based on results and history Colon Cancer Screening Every 1-10 years based on test performed, risk factors,  and history Starting at age 71 Bone Density Screening Every 2-10 years based on history Starting at age 66 for women Recommendations for men differ based on medication usage, history, and risk factors AAA Screening One time ultrasound Men 27-66 years old who have every smoked Lung Cancer Screening Low Dose Lung CT every 12 months Age 68-80 years with a 30 pack-year smoking history who still smoke or who have quit within the last 15 years  Screening Labs Routine  Labs: Complete Blood Count (CBC), Complete Metabolic Panel (CMP), Cholesterol (Lipid Panel) Every 6-12 months based on history and medications May be recommended more frequently based on current conditions or previous results Hemoglobin A1c Lab Every 3-12 months based on history and previous results Starting at  age 88 or earlier with diagnosis of diabetes, high cholesterol, BMI >26, and/or risk factors Frequent monitoring for patients with diabetes to ensure blood sugar control Thyroid Panel (TSH w/ T3 & T4) Every 6 months based on history, symptoms, and risk factors May be repeated more often if on medication HIV One time testing for all patients 77 and older May be repeated more frequently for patients with increased risk factors or exposure Hepatitis C One time testing for all patients 22 and older May be repeated more frequently for patients with increased risk factors or exposure Gonorrhea, Chlamydia Every 12 months for all sexually active persons 13-24 years Additional monitoring may be recommended for those who are considered high risk or who have symptoms PSA Men 13-23 years old with risk factors Additional screening may be recommended from age 81-69 based on risk factors, symptoms, and history  Vaccine Recommendations Tetanus Booster All adults every 10 years Flu Vaccine All patients 6 months and older every year COVID Vaccine All patients 12 years and older Initial dosing with booster May recommend additional booster based on age and health history HPV Vaccine 2 doses all patients age 3-26 Dosing may be considered for patients over 26 Shingles Vaccine (Shingrix) 2 doses all adults 64 years and older Pneumonia (Pneumovax 23) All adults 55 years and older May recommend earlier dosing based on health history Pneumonia (Prevnar 46) All adults 26 years and older Dosed 1 year after Pneumovax 23  Additional Screening, Testing, and Vaccinations may be recommended on an individualized basis based on family history, health history, risk factors, and/or exposure.  __________________________________________________________  Diet Recommendations for All Patients  I recommend that all patients maintain a diet low in saturated fats, carbohydrates, and cholesterol. While this can be  challenging at first, it is not impossible and small changes can make big differences.  Things to try: Decreasing the amount of soda, sweet tea, and/or juice to one or less per day and replace with water While water is always the first choice, if you do not like water you may consider adding a water additive without sugar to improve the taste other sugar free drinks Replace potatoes with a brightly colored vegetable at dinner Use healthy oils, such as canola oil or olive oil, instead of butter or hard margarine Limit your bread intake to two pieces or less a day Replace regular pasta with low carb pasta options Bake, broil, or grill foods instead of frying Monitor portion sizes  Eat smaller, more frequent meals throughout the day instead of large meals  An important thing to remember is, if you love foods that are not great for your health, you don't have to give them up completely. Instead, allow these foods to be a reward  when you have done well. Allowing yourself to still have special treats every once in a while is a nice way to tell yourself thank you for working hard to keep yourself healthy.   Also remember that every day is a new day. If you have a bad day and "fall off the wagon", you can still climb right back up and keep moving along on your journey!  We have resources available to help you!  Some websites that may be helpful include: www.http://carter.biz/  Www.VeryWell[employer].com _____________________________________________________________  Activity Recommendations for All Patients  I recommend that all adults get at least 20 minutes of moderate physical activity that elevates your heart rate at least 5 days out of the week.  Some examples include: Walking or jogging at a pace that allows you to carry on a conversation Cycling (stationary bike or outdoors) Water aerobics Yoga Weight lifting Dancing If physical limitations prevent you from putting stress on your joints, exercise  in a pool or seated in a chair are excellent options.  Do determine your MAXIMUM heart rate for activity: YOUR AGE - 220 = MAX HeartRate   Remember! Do not push yourself too hard.  Start slowly and build up your pace, speed, weight, time in exercise, etc.  Allow your body to rest between exercise and get good sleep. You will need more water than normal when you are exerting yourself. Do not wait until you are thirsty to drink. Drink with a purpose of getting in at least 8, 8 ounce glasses of water a day plus more depending on how much you exercise and sweat.    If you begin to develop dizziness, chest pain, abdominal pain, jaw pain, shortness of breath, headache, vision changes, lightheadedness, or other concerning symptoms, stop the activity and allow your body to rest. If your symptoms are severe, seek emergency evaluation immediately. If your symptoms are concerning, but not severe, please let us know so that we can recommend further evaluation.

## 2021-01-25 NOTE — Assessment & Plan Note (Signed)
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  Unable to locate evidence suggesting length of time surgical intervention is typically effective, however, did discuss with patient that the younger the age at presentation, the more likely for recurrence is noted in the literature.  I do feel that re-evaluation with the hand surgeon is warranted given the significance of the contracture and likelihood of development in the right hand with time.  He will let me know if he wishes to proceed with treatment.  I will continue to research to see if I can determine an estimated time frame expected for management before recurrence after surgery.

## 2021-01-25 NOTE — Assessment & Plan Note (Signed)
Fatigue NOS post COVID.  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  It is unclear if this is linked or coincidental.  We will obtain labs today for further evaluation and determine if findings are present that may be consistent with his symptoms.

## 2021-01-25 NOTE — Progress Notes (Signed)
Orma Render, DNP, AGNP-c [facility] [address]   [place], [place] [address] [phone]  New patient visit   Patient: Austin Reeves.   DOB: January 07, 1961   60 y.o. Male  MRN: [id number] Visit Date: 01/25/2021  Patient Care Team: Imer Foxworth, Coralee Pesa, NP as PCP - General (Nurse Practitioner)  Today's healthcare provider: Orma Render, NP   Chief Complaint  Patient presents with   New Patient (Initial Visit)    Patient presents today to establish care. He has not seen a physician in years. He would like a complete lab panel. Two weeks ago he was sitting in his recliner and felt his heart flutter this continued X 1 week. He did present today that he thinks he may have a sinus infection. He takes no daily medications. His wife is a Automotive engineer with [employer].    Subjective    HPI  Austin Reeves. is a 60 y.o. male who presents today as a new patient to establish care.    Austin Reeves is married and lives at home with his wife and one of his three sons. He is very close to his sons and enjoys hunting and fishing adventures with them on a regular basis. 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 He endorses he feels safe at home and in his relationships. He denies recreational drug use or nicotine. He enjoys alcohol on social occasions with no concerns for overuse.  He enjoys a healthy, well balanced diet where he and his family procure their own meat through hunting and raising farm animals. His wife is a Automotive engineer with [employer] and encourages very healthy eating and lifestyle. He endorses being very active at work and home.   He endorses concerns today with sinus pain, Dupuytren contracture, decreased energy, and history of "flutter" in the heart.  Sinus pain He endorses sinus pain, pressure, congestion, and increased mucous production for about a week.  He denies any fever, chills, or cough present.  He has  been taking tylenol cold and flu and this is helpful for symptoms, but the return as soon as the medication wears off. He is concerned that this could be moving into a bacterial infection given the length of time symptoms have been present.   Dupuytren contracture He endorses a history of contracture in the palmar surface of the left hand at the base of the fifth finger.  He has been seen by a hand surgeon for this condition and was told this was Dupuytren.  He reports he also has nodules on the plantar fascia and the palmar surface of the right hand at the base of the fifth finger that have presented.  He reports that the hand surgeon informed him the contracture could be surgically released, however, he was told that this would likely return and therefore he decided not to follow through with the treatment.  He is concerned about the evidence of same on the right hand and tells me that he would have to have this released if the contracture develops.  He would like to know what next steps he should take.   Decreased energy He endorses having COVID in the fall of 2020 and since that time he reports significant decrease in energy and stamina.  He tells me that he is very active and enjoys hunting and hiking with his sons, but a recent hunting trip he found  himself struggling to keep up. This is unusual for him.  He is unsure if the COVID or something else could be contributing to the symptoms.   Palpitations He reports a week span around thanksgiving where he suddenly felt "fluttering" in his heart.  At the time he was not being active and was not anxious.  He reports the sensation came on intermittently for about a week with no other symptoms.  He denies CP, dizziness, ShOB, weakness, numbness, sweating, shoulder or jaw pain during the episodes.  He tells me that he did feel his pulse and he could feel it was irregular but only intermittently and for short periods.  He reports these symptoms  were new and have not happened since.  He would just like this to be on record.   Past Medical History:  Diagnosis Date   Medical history non-contributory    Past Surgical History:  Procedure Laterality Date   COLONOSCOPY N/A 08/05/2013   Procedure: COLONOSCOPY;  Surgeon: Rogene Houston, MD;  [facility];  Service: Endoscopy;  Laterality: N/A;  66   HIP SURGERY     fractured right hip while snow skiing. did not have replacement.    POLYPECTOMY  08/05/2013   Procedure: POLYPECTOMY;  Surgeon: Rogene Houston, MD;  [facility];  Service: Endoscopy;;   Urinary stent     football injury   Family Status  Relation Name Status   Father Austin Reeves Deceased   Mother Austin Reeves Deceased   Neg Hx  (Not Specified)   Family History  Problem Relation Age of Onset   Lung cancer Father    Heart attack Father    Diabetes Father    Parkinson's disease Mother    Alzheimer's disease Mother    Colon cancer Neg Hx    Social History   Socioeconomic History   Marital status: Married    Spouse name: Austin Reeves   Number of children: 3   Years of education: Not on file   Highest education level: High school graduate  Occupational History   Occupation: Engineer, building services  Tobacco Use   Smoking status: Never   Smokeless tobacco: Never  Vaping Use   Vaping Use: Never used  Substance and Sexual Activity   Alcohol use: Yes    Alcohol/week: 4.0 standard drinks    Types: 4 Cans of beer per week    Comment: occasional   Drug use: No   Sexual activity: Yes  Other Topics Concern   Not on file  Social History Narrative   Freight forwarder for a [employer], 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    Married for over 20 years.   Has 2 sons, one graduated college recently  And one is in college.   Eats all foods.   Wear seatbelt.   Exercise by working in yard.    Grew up in [place].    Social Determinants of Health   Financial Resource  Strain: Not on file  Food Insecurity: Not on file  Transportation Needs: Not on file  Physical Activity: Not on file  Stress: Not on file  Social Connections: Not on file   No outpatient medications prior to visit.   No facility-administered medications prior to visit.   Not on File  Immunization History  Administered Date(s) Administered   Influenza Inj Mdck Quad Pf 12/16/2018   Influenza-Unspecified 10/28/2020   Tdap 06/13/2017  Health Maintenance  Topic Date Due   COVID-19 Vaccine (1) Never done   Zoster Vaccines- Shingrix (1 of 2) Never done   COLONOSCOPY (Pts 45-47yr Insurance coverage will need to be confirmed)  08/05/2020   TETANUS/TDAP  06/14/2027   INFLUENZA VACCINE  Completed   Hepatitis C Screening  Completed   HIV Screening  Completed   Pneumococcal Vaccine [id number] old  Aged Out   HPV VACCINES  Aged Out    Patient Care Team: Reyansh Kushnir, SCoralee Pesa NP as PCP - General (Nurse Practitioner)  Review of Systems All review of systems negative except what is listed in the HPI   Objective    BP 122/72    Pulse 81    Ht 6' (1.829 m)    Wt 204 lb 8 oz (92.8 kg)    SpO2 97%    BMI 27.74 kg/m  Physical Exam Vitals and nursing note reviewed.  Constitutional:      Appearance: Normal appearance.  HENT:     Head: Normocephalic.  Eyes:     Extraocular Movements: Extraocular movements intact.     Conjunctiva/sclera: Conjunctivae normal.     Pupils: Pupils are equal, round, and reactive to light.  Neck:     Vascular: No carotid bruit.  Cardiovascular:     Rate and Rhythm: Normal rate and regular rhythm.     Pulses: Normal pulses.     Heart sounds: Normal heart sounds. No murmur heard. Pulmonary:     Effort: Pulmonary effort is normal.     Breath sounds: Normal breath sounds. No wheezing.  Abdominal:     General: Abdomen is flat. Bowel sounds are normal. There is no distension.     Palpations: Abdomen is soft.     Tenderness: There is no abdominal tenderness.  There is no guarding.  Musculoskeletal:        General: Normal range of motion.     Cervical back: Normal range of motion.     Right lower leg: No edema.     Left lower leg: No edema.  Lymphadenopathy:     Cervical: No cervical adenopathy.  Skin:    General: Skin is warm and dry.     Capillary Refill: Capillary refill takes less than 2 seconds.  Neurological:     General: No focal deficit present.     Mental Status: He is alert and oriented to person, place, and time.     Motor: No weakness.  Psychiatric:        Mood and Affect: Mood normal.        Behavior: Behavior normal.        Thought Content: Thought content normal.        Judgment: Judgment normal.    Depression Screen PHQ 2/9 Scores 01/25/2021 06/13/2017  PHQ - 2 Score 0 0  PHQ- 9 Score 0 -   No results found for any visits on 01/25/21.  Assessment & Plan      Problem List Items Addressed This Visit     Acute non-recurrent pansinusitis - Primary    Symptoms consistent with bacterial sinusitis given time present.  He has responded well to amoxicillin in the past, therefore we will begin treatment today with this.  Recommend continued use of OTC medications to help manage symptoms as needed.  If symptoms worsen or persist despite treatment, recommend he contact u[place]orea[place] f/u      Relevant Medications   amoxicillin (AMOXIL) 875 MG tablet   Dupuytren's contracture  of left hand    Evidence of contracture on left hand with likely forming contracture to right hand present.  Unable to locate evidence suggesting length of time surgical intervention is typically effective, however, did discuss with patient that the younger the age at presentation, the more likely for recurrence is noted in the literature.  I do feel that re-evaluation with the hand surgeon is warranted given the significance of the contracture and likelihood of development in the right hand with time.  He will let me know if he wishes to proceed with  treatment.  I will continue to research to see if I can determine an estimated time frame expected for management before recurrence after surgery.       History of palpitations    Series of intermittent palpitations for 1 week approximately one month ago without recurrence.  No symptoms present today No alarm symptoms present.  HRR with no signs of ectopy or irregular beats. No HTN or HLD history.  Unclear etiology at this time, however, encouraged patient to seek evaluation immediately if these symptoms return.  We will obtain labs today for further evaluation of possible electrolyte imbalance or other findings that could be causative.  If reocurrs, will plan for EKG and Zio patch monitoring with cardiology referral for evaluation.       Fatigue    Fatigue NOS post COVID.  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  It is unclear if this is linked or coincidental.  We will obtain labs today for further evaluation and determine if findings are present that may be consistent with his symptoms.        Other Visit Diagnoses     Screening cholesterol level       Relevant Orders   CBC with Differential/Platelet   Comprehensive metabolic panel   Lipid panel   Hemoglobin A1c   Screening for deficiency anemia       Relevant Orders   CBC with Differential/Platelet   Comprehensive metabolic panel   Lipid panel   Hemoglobin A1c   Screening for endocrine, nutritional, metabolic and immunity disorder       Relevant Orders   CBC with Differential/Platelet   Comprehensive metabolic panel   Lipid panel   Hemoglobin A1c   Encounter for medical examination to establish care            Return in about 1 year (around 01/25/2022) for CPE in 1 year.      Urbano Milhouse, Coralee Pesa, NP, DNP, AGNP-C [facility] at [facility]

## 2021-01-26 LAB — CBC WITH DIFFERENTIAL/PLATELET
Basophils Absolute: 0.1 10*3/uL (ref 0.0–0.2)
Basos: 1 %
EOS (ABSOLUTE): 0.5 10*3/uL — ABNORMAL HIGH (ref 0.0–0.4)
Eos: 4 %
Hematocrit: 43.9 % (ref 37.5–51.0)
Hemoglobin: 14.8 g/dL (ref 13.0–17.7)
Immature Grans (Abs): 0 10*3/uL (ref 0.0–0.1)
Immature Granulocytes: 0 %
Lymphocytes Absolute: 2.5 10*3/uL (ref 0.7–3.1)
Lymphs: 19 %
MCH: 30.1 pg (ref 26.6–33.0)
MCHC: 33.7 g/dL (ref 31.5–35.7)
MCV: 89 fL (ref 79–97)
Monocytes Absolute: 1.1 10*3/uL — ABNORMAL HIGH (ref 0.1–0.9)
Monocytes: 8 %
Neutrophils Absolute: 9.1 10*3/uL — ABNORMAL HIGH (ref 1.4–7.0)
Neutrophils: 68 %
Platelets: 249 10*3/uL (ref 150–450)
RBC: 4.91 x10E6/uL (ref 4.14–5.80)
RDW: 12.1 % (ref 11.6–15.4)
WBC: 13.3 10*3/uL — ABNORMAL HIGH (ref 3.4–10.8)

## 2021-01-26 LAB — COMPREHENSIVE METABOLIC PANEL
ALT: 19 IU/L (ref 0–44)
AST: 20 IU/L (ref 0–40)
Albumin/Globulin Ratio: 2.1 (ref 1.2–2.2)
Albumin: 4.7 g/dL (ref 3.8–4.9)
Alkaline Phosphatase: 107 IU/L (ref 44–121)
BUN/Creatinine Ratio: 15 (ref 9–20)
BUN: 14 mg/dL (ref 6–24)
Bilirubin Total: 0.4 mg/dL (ref 0.0–1.2)
CO2: 24 mmol/L (ref 20–29)
Calcium: 10.1 mg/dL (ref 8.7–10.2)
Chloride: 99 mmol/L (ref 96–106)
Creatinine, Ser: 0.96 mg/dL (ref 0.76–1.27)
Globulin, Total: 2.2 g/dL (ref 1.5–4.5)
Glucose: 96 mg/dL (ref 70–99)
Potassium: 4.6 mmol/L (ref 3.5–5.2)
Sodium: 137 mmol/L (ref 134–144)
Total Protein: 6.9 g/dL (ref 6.0–8.5)
eGFR: 91 mL/min/{1.73_m2} (ref >59)

## 2021-01-26 LAB — LIPID PANEL
Chol/HDL Ratio: 3.7 ratio (ref 0.0–5.0)
Cholesterol, Total: 223 mg/dL — ABNORMAL HIGH (ref 100–199)
HDL: 61 mg/dL (ref >39)
LDL Chol Calc (NIH): 133 mg/dL — ABNORMAL HIGH (ref 0–99)
Triglycerides: 163 mg/dL — ABNORMAL HIGH (ref 0–149)
VLDL Cholesterol Cal: 29 mg/dL (ref 5–40)

## 2021-01-26 LAB — HEMOGLOBIN A1C
Est. average glucose Bld gHb Est-mCnc: 117 mg/dL
Hgb A1c MFr Bld: 5.7 % — ABNORMAL HIGH (ref 4.8–5.6)

## 2021-02-05 ENCOUNTER — Telehealth (HOSPITAL_BASED_OUTPATIENT_CLINIC_OR_DEPARTMENT_OTHER): Payer: Self-pay

## 2021-02-05 ENCOUNTER — Encounter (HOSPITAL_BASED_OUTPATIENT_CLINIC_OR_DEPARTMENT_OTHER): Payer: Self-pay

## 2021-02-05 DIAGNOSIS — E782 Mixed hyperlipidemia: Secondary | ICD-10-CM

## 2021-02-05 NOTE — Addendum Note (Signed)
Addended by: Bobby Rumpf C on: 02/05/2021 11:47 AM   Modules accepted: Orders

## 2021-02-05 NOTE — Telephone Encounter (Signed)
Patient is aware and agreeable to lab results and recommendations Patient was not fasting at time of testing Patient is agreeable to coming in and having repeat fasting lipid in 4-6 months

## 2021-02-05 NOTE — Telephone Encounter (Signed)
Left message for patient to call back for results and recommendations. 

## 2021-02-05 NOTE — Telephone Encounter (Signed)
This encounter was created in error - please disregard.

## 2021-02-05 NOTE — Telephone Encounter (Signed)
-----   Message from Orma Render, NP sent at 01/26/2021  7:51 PM EST ----- Please call patient.  Labs have returned.  CBC does show slight elevation in white blood cell count however this is most likely due to the infection that was present and should return to normal.  There are no signs of anemia or other concerning findings.  Metabolic panel shows that blood glucose is within normal limits.  Kidney function looks great electrolyte function is exactly where it should be, and liver function is fantastic!  No concerning findings.  Cholesterol levels are a little higher than I would like to see them.  LDL (bad cholesterol) is up to 133 we really would like to have this less than 99 for optimal control.  Triglycerides are also elevated.  I am not sure if you were fasting at the time of the labs.  If you were not fasting this could definitely change the results of the labs and so it may not be concerning at all.  Let me know if you are not fasting.  Your hemoglobin A1c is slightly elevated it is right above the limit and follows within the prediabetes category.  Hemoglobin A1c monitors her average blood sugar over the past 3 months and gives [place] an idea if your insulin is working properly in your body.  We have a few options with elevated hemoglobin A1c.  1 option is that we can make some dietary changes and continue to monitor.  Another option is to start medication to help with management. If you would like to try dietary changes alone I recommend the following:  Decrease your saturated fat intake to no more than 13 g/day Increase your fiber to at least 30 g/day Decrease your carbohydrate intake to no more than 150 g a day. Walk at least 20 minutes with a purposeful brisk walk every day.  If you like to discuss medication options we can certainly set up an appointment to do that!  We will plan to check these labs again in about 4 to 6 months and see where you stand.

## 2022-01-31 ENCOUNTER — Encounter (HOSPITAL_BASED_OUTPATIENT_CLINIC_OR_DEPARTMENT_OTHER): Payer: BC Managed Care – PPO | Admitting: Nurse Practitioner

## 2022-03-26 ENCOUNTER — Telehealth: Payer: Self-pay | Admitting: Family Medicine

## 2022-03-26 DIAGNOSIS — R739 Hyperglycemia, unspecified: Secondary | ICD-10-CM

## 2022-03-26 DIAGNOSIS — Z125 Encounter for screening for malignant neoplasm of prostate: Secondary | ICD-10-CM

## 2022-03-26 DIAGNOSIS — R5383 Other fatigue: Secondary | ICD-10-CM

## 2022-03-26 DIAGNOSIS — E782 Mixed hyperlipidemia: Secondary | ICD-10-CM

## 2022-03-26 NOTE — Telephone Encounter (Signed)
New patient has physical on 5/21 with Dr. Lacinda Axon and needing labs done

## 2022-03-28 NOTE — Telephone Encounter (Signed)
Blood work ordered in EPIC.    Left message to return call 

## 2022-03-28 NOTE — Telephone Encounter (Signed)
Cook, Jayce G, DO     CBC w/ diff, CMP, Lipid, A1C, PSA.

## 2022-04-05 NOTE — Telephone Encounter (Signed)
Patient made aware per drs orders. 

## 2022-06-18 ENCOUNTER — Encounter: Payer: BC Managed Care – PPO | Admitting: Family Medicine

## 2022-09-06 ENCOUNTER — Encounter: Payer: BC Managed Care – PPO | Admitting: Family Medicine

## 2022-09-27 DIAGNOSIS — R5383 Other fatigue: Secondary | ICD-10-CM | POA: Diagnosis not present

## 2022-09-27 DIAGNOSIS — E782 Mixed hyperlipidemia: Secondary | ICD-10-CM | POA: Diagnosis not present

## 2022-09-27 DIAGNOSIS — R739 Hyperglycemia, unspecified: Secondary | ICD-10-CM | POA: Diagnosis not present

## 2022-09-27 DIAGNOSIS — Z125 Encounter for screening for malignant neoplasm of prostate: Secondary | ICD-10-CM | POA: Diagnosis not present

## 2022-09-28 LAB — CMP14+EGFR: Chloride: 103 mmol/L (ref 96–106)

## 2022-10-04 ENCOUNTER — Ambulatory Visit (INDEPENDENT_AMBULATORY_CARE_PROVIDER_SITE_OTHER): Payer: BC Managed Care – PPO | Admitting: Family Medicine

## 2022-10-04 VITALS — BP 137/71 | HR 69 | Temp 98.3°F | Ht 71.0 in | Wt 196.2 lb

## 2022-10-04 DIAGNOSIS — Z1211 Encounter for screening for malignant neoplasm of colon: Secondary | ICD-10-CM

## 2022-10-04 DIAGNOSIS — Z0000 Encounter for general adult medical examination without abnormal findings: Secondary | ICD-10-CM

## 2022-10-04 DIAGNOSIS — Z23 Encounter for immunization: Secondary | ICD-10-CM | POA: Diagnosis not present

## 2022-10-04 DIAGNOSIS — Z1283 Encounter for screening for malignant neoplasm of skin: Secondary | ICD-10-CM

## 2022-10-04 DIAGNOSIS — R7303 Prediabetes: Secondary | ICD-10-CM | POA: Diagnosis not present

## 2022-10-04 DIAGNOSIS — Z0001 Encounter for general adult medical examination with abnormal findings: Secondary | ICD-10-CM

## 2022-10-04 DIAGNOSIS — E785 Hyperlipidemia, unspecified: Secondary | ICD-10-CM | POA: Diagnosis not present

## 2022-10-04 NOTE — Patient Instructions (Signed)
Referrals placed.  If you change your mind and want surgery for the Dupuytren's contracture please let me know.  If you have palpitations again, please let me know.  Watch diet and limit alcohol to 2 beverages a day.  Follow-up annually.

## 2022-10-06 DIAGNOSIS — E785 Hyperlipidemia, unspecified: Secondary | ICD-10-CM | POA: Insufficient documentation

## 2022-10-06 DIAGNOSIS — R7303 Prediabetes: Secondary | ICD-10-CM | POA: Insufficient documentation

## 2022-10-06 DIAGNOSIS — Z0000 Encounter for general adult medical examination without abnormal findings: Secondary | ICD-10-CM | POA: Insufficient documentation

## 2022-10-06 NOTE — Assessment & Plan Note (Addendum)
Referral placed for colonoscopy as well as to dermatology for skin exam. Labs reviewed with the patient.  He is proceeding with lifestyle changes in regards to prediabetes and hyperlipidemia. Flu shot given today. He does not desire COVID-19 vaccination at this time. Advised to consider shingles vaccine. Declined referral to cardiology for palpitations.  If this recurs we will proceed with referral.

## 2022-10-07 ENCOUNTER — Encounter: Payer: Self-pay | Admitting: *Deleted

## 2022-10-15 ENCOUNTER — Telehealth: Payer: Self-pay | Admitting: Internal Medicine

## 2022-10-15 NOTE — Telephone Encounter (Signed)
Questionnaire in review.

## 2022-10-17 NOTE — Telephone Encounter (Signed)
  Procedure: Colonoscopy  Height: 5'11 Weight: 197lbs       Have you had a colonoscopy before?  2015 Dr. Karilyn Cota  Do you have family history of colon cancer?  no  Do you have a family history of polyps? no  Previous colonoscopy with polyps removed? yes  Do you have a history colorectal cancer?   no  Are you diabetic?  no  Do you have a prosthetic or mechanical heart valve? no  Do you have a pacemaker/defibrillator?   no  Have you had endocarditis/atrial fibrillation?  no  Do you use supplemental oxygen/CPAP?  no  Have you had joint replacement within the last 12 months?  no  Do you tend to be constipated or have to use laxatives?  no   Do you have history of alcohol use? If yes, how much and how often.  no  Do you have history or are you using drugs? If yes, what do are you  using?  no  Have you ever had a stroke/heart attack?  no  Have you ever had a heart or other vascular stent placed,?no  Do you take weight loss medication? no  Do you take any blood-thinning medications such as: (Plavix, aspirin, Coumadin, Aggrenox, Brilinta, Xarelto, Eliquis, Pradaxa, Savaysa or Effient)? no  If yes we need the name, milligram, dosage and who is prescribing doctor:               Takes no medications    No Known Allergies

## 2022-10-30 NOTE — Telephone Encounter (Signed)
OK to schedule. ASA 2.  ?

## 2022-10-30 NOTE — Telephone Encounter (Signed)
LMOVM TO CALL BACK  

## 2022-10-31 ENCOUNTER — Telehealth: Payer: Self-pay | Admitting: Internal Medicine

## 2022-10-31 NOTE — Telephone Encounter (Signed)
Patient left a message that he was returning your call

## 2022-10-31 NOTE — Telephone Encounter (Signed)
LMTCB

## 2022-11-13 ENCOUNTER — Other Ambulatory Visit: Payer: Self-pay | Admitting: *Deleted

## 2022-11-13 ENCOUNTER — Encounter: Payer: Self-pay | Admitting: *Deleted

## 2022-11-13 MED ORDER — PEG 3350-KCL-NA BICARB-NACL 420 G PO SOLR: 4000.0000 mL | Freq: Once | ORAL | 0 refills | Status: AC

## 2022-11-13 NOTE — Telephone Encounter (Signed)
[place] PA: The member does not have SOC coverage. The procedure cannot be added.

## 2022-11-13 NOTE — Telephone Encounter (Signed)
Thanks

## 2022-11-13 NOTE — Telephone Encounter (Signed)
Message Received: Today Dolores Frame, MD  Elinor Dodge, LPN Hi, yes please schedule with me and send me the triage. Thanks       Previous Messages    ----- Message ----- From: Elinor Dodge, LPN Sent: 16/10/9602   1:19 PM EDT To: Dolores Frame, MD  This pt was sent a questionnaire for the [place] office. His wife Boyd Kerbs says he would to schedule his colonoscopy with you. Please advise. Thank you

## 2022-11-13 NOTE — Telephone Encounter (Signed)
LMTRC

## 2022-11-13 NOTE — Telephone Encounter (Signed)
Pt wife Boyd Kerbs left message in regards to getting TCS scheduled. 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 Boyd Kerbs # [phone]

## 2022-11-13 NOTE — Telephone Encounter (Signed)
Pt has been scheduled for 11/29/22. Instructions mailed and prep sent to the pharmacy. Triage questionnaire has been routed to [facility].

## 2022-11-13 NOTE — Telephone Encounter (Signed)
Pt has been scheduled for 11/29/22, instructions mailed and prep sent to the pharmacy.

## 2022-11-29 ENCOUNTER — Ambulatory Visit (HOSPITAL_COMMUNITY): Payer: BC Managed Care – PPO | Admitting: Anesthesiology

## 2022-11-29 ENCOUNTER — Other Ambulatory Visit: Payer: Self-pay

## 2022-11-29 ENCOUNTER — Ambulatory Visit (HOSPITAL_COMMUNITY)
Admission: RE | Admit: 2022-11-29 | Discharge: 2022-11-29 | Disposition: A | Discharge status: Home / Self Care | Payer: BC Managed Care – PPO | Attending: Gastroenterology | Admitting: Gastroenterology

## 2022-11-29 ENCOUNTER — Encounter (HOSPITAL_COMMUNITY): Payer: Self-pay | Admitting: Gastroenterology

## 2022-11-29 ENCOUNTER — Encounter (HOSPITAL_COMMUNITY)
Admission: RE | Disposition: A | Discharge status: Home / Self Care | Payer: Self-pay | Source: Home / Self Care | Attending: Gastroenterology

## 2022-11-29 DIAGNOSIS — Z860101 Personal history of adenomatous and serrated colon polyps: Secondary | ICD-10-CM | POA: Diagnosis not present

## 2022-11-29 DIAGNOSIS — K649 Unspecified hemorrhoids: Secondary | ICD-10-CM | POA: Diagnosis not present

## 2022-11-29 DIAGNOSIS — Z860100 Personal history of colon polyps, unspecified: Secondary | ICD-10-CM

## 2022-11-29 DIAGNOSIS — Z09 Encounter for follow-up examination after completed treatment for conditions other than malignant neoplasm: Secondary | ICD-10-CM | POA: Insufficient documentation

## 2022-11-29 DIAGNOSIS — Z1211 Encounter for screening for malignant neoplasm of colon: Secondary | ICD-10-CM | POA: Diagnosis not present

## 2022-11-29 DIAGNOSIS — K648 Other hemorrhoids: Secondary | ICD-10-CM | POA: Diagnosis not present

## 2022-11-29 HISTORY — PX: COLONOSCOPY WITH PROPOFOL: SHX5780

## 2022-11-29 SURGERY — COLONOSCOPY WITH PROPOFOL
Anesthesia: General

## 2022-11-29 MED ORDER — PROPOFOL 500 MG/50ML IV EMUL
INTRAVENOUS | Status: DC | PRN
  Administered 2022-11-29: 125 ug/kg/min via INTRAVENOUS

## 2022-11-29 MED ORDER — LACTATED RINGERS IV SOLN: INTRAVENOUS | Status: DC | PRN

## 2022-11-29 MED ORDER — SODIUM CHLORIDE 0.9% FLUSH: 10.0000 mL | Freq: Two times a day (BID) | INTRAVENOUS | Status: DC

## 2022-11-29 MED ORDER — STERILE WATER FOR IRRIGATION IR SOLN
Status: DC | PRN
  Administered 2022-11-29: 60 mL

## 2022-11-29 MED ORDER — PROPOFOL 10 MG/ML IV BOLUS
INTRAVENOUS | Status: DC | PRN
  Administered 2022-11-29: 80 mg via INTRAVENOUS
  Administered 2022-11-29: 120 mg via INTRAVENOUS

## 2022-11-29 MED ORDER — PROPOFOL 500 MG/50ML IV EMUL
INTRAVENOUS | Status: AC
  Filled 2022-11-29: qty 50

## 2022-11-29 NOTE — H&P (Signed)
Austin Reeves. is an 62 y.o. male.   Chief Complaint: history colon polyps HPI: 62 year old male, coming for history of colon polyps.  Last colonoscopy in 2015, had 2 tubular adenomas removed.  The patient denies having any complaints such as melena, hematochezia, abdominal pain or distention, change in her bowel movement consistency or frequency, no changes in weight recently.  No family history of colorectal cancer.   Past Medical History:  Diagnosis Date   Medical history non-contributory     Past Surgical History:  Procedure Laterality Date   COLONOSCOPY N/A 08/05/2013   Procedure: COLONOSCOPY;  Surgeon: Malissa Hippo, MD;  [facility];  Service: Endoscopy;  Laterality: N/A;  1030   HIP SURGERY     fractured right hip while snow skiing. did not have replacement.    POLYPECTOMY  08/05/2013   Procedure: POLYPECTOMY;  Surgeon: Malissa Hippo, MD;  [facility];  Service: Endoscopy;;   Urinary stent     football injury    Family History  Problem Relation Age of Onset   Lung cancer Father    Heart attack Father    Diabetes Father    Parkinson's disease Mother    Alzheimer's disease Mother    Colon cancer Neg Hx    Social History:  reports that he has never smoked. He has never used smokeless tobacco. He reports current alcohol use of about 4.0 standard drinks of alcohol per week. He reports that he does not use drugs.  Allergies: No Known Allergies  No medications prior to admission.    No results found for this or any previous visit (from the past 48 hour(s)). No results found.  Review of Systems  All other systems reviewed and are negative.   Blood pressure 135/81, pulse 72, temperature 97.9 F (36.6 C), temperature source Oral, resp. rate 14, height 6' (1.829 m), weight 89.4 kg, SpO2 98%. Physical Exam  GENERAL: The patient is AO x3, in no acute distress. HEENT: Head is normocephalic and atraumatic. EOMI are intact. Mouth is well hydrated  and without lesions. NECK: Supple. No masses LUNGS: Clear to auscultation. No presence of rhonchi/wheezing/rales. Adequate chest expansion HEART: RRR, normal s1 and s2. ABDOMEN: Soft, nontender, no guarding, no peritoneal signs, and nondistended. BS +. No masses. EXTREMITIES: Without any cyanosis, clubbing, rash, lesions or edema. NEUROLOGIC: AOx3, no focal motor deficit. SKIN: no jaundice, no rashes  Assessment/Plan 62 year old male, coming for history of colon polyps.  Last colonoscopy in 2015, had 2 tubular adenomas removed.  Will proceed with colonoscopy.  Dolores Frame, MD 11/29/2022, 1:06 PM

## 2022-11-29 NOTE — Op Note (Signed)
[facility] Patient Name: Austin Reeves Procedure Date: 11/29/2022 1:58 PM MRN: [id number] Date of Birth: [birth year] Attending MD: Katrinka Blazing , , [id number] CSN: [id number] Age: 62 Admit Type: Outpatient Procedure:                Colonoscopy Indications:              Surveillance: Personal history of adenomatous                            polyps on last colonoscopy > 5 years ago Providers:                Katrinka Blazing, Angelica Ran, Francoise Ceo RN, RN,                            Judeth Cornfield. Jessee Avers, Technician, Elinor Parkinson Referring MD:              Medicines:                Monitored Anesthesia Care Complications:            No immediate complications. Estimated Blood Loss:     Estimated blood loss: none. Procedure:                Pre-Anesthesia Assessment:                           - Prior to the procedure, a History and Physical                            was performed, and patient medications, allergies                            and sensitivities were reviewed. The patient's                            tolerance of previous anesthesia was reviewed.                           - The risks and benefits of the procedure and the                            sedation options and risks were discussed with the                            patient. All questions were answered and informed                            consent was obtained.                           After obtaining informed consent, the colonoscope                            was passed under direct vision. Throughout the  procedure, the patient's blood pressure, pulse, and                            oxygen saturations were monitored continuously. The                            [id number]-HQ190L ([id number]) scope was introduced through                            the anus and advanced to the the cecum, identified                            by appendiceal orifice and ileocecal valve. The                             colonoscopy was performed without difficulty. The                            patient tolerated the procedure well. The quality                            of the bowel preparation was good. Scope In: 2:14:23 PM Scope Out: 2:30:34 PM Scope Withdrawal Time: 0 hours 11 minutes 55 seconds  Total Procedure Duration: 0 hours 16 minutes 11 seconds  Findings:      The perianal and digital rectal examinations were normal.      The entire examined colon appeared normal.      Non-bleeding internal hemorrhoids were found during retroflexion. The       hemorrhoids were small. Impression:               - The entire examined colon is normal.                           - Non-bleeding internal hemorrhoids.                           - No specimens collected. Moderate Sedation:      Per Anesthesia Care Recommendation:           - Discharge patient to home (ambulatory).                           - Resume previous diet.                           - Repeat colonoscopy in 10 years for screening                            purposes. Procedure Code(s):        --- Professional ---                           J4782, Colorectal cancer screening; colonoscopy on                            individual at high risk Diagnosis Code(s):        ---  Professional ---                           Z86.010, Personal history of colonic polyps                           K64.8, Other hemorrhoids CPT copyright 2022 American Medical Association. All rights reserved. The codes documented in this report are preliminary and upon coder review may  be revised to meet current compliance requirements. Katrinka Blazing, MD Katrinka Blazing,  11/29/2022 2:34:04 PM This report has been signed electronically. Number of Addenda: 0

## 2022-11-29 NOTE — Anesthesia Procedure Notes (Signed)
Date/Time: 11/29/2022 2:06 PM  Performed by: Franco Nones, CRNAPre-anesthesia Checklist: Patient identified, Emergency Drugs available, Suction available, Timeout performed and Patient being monitored Patient Re-evaluated:Patient Re-evaluated prior to induction Oxygen Delivery Method: Nasal Cannula

## 2022-11-29 NOTE — Transfer of Care (Signed)
Immediate Anesthesia Transfer of Care Note  Patient: Austin Reeves.  Procedure(s) Performed: COLONOSCOPY WITH PROPOFOL  Patient Location: PACU  Anesthesia Type:General  Level of Consciousness: awake and patient cooperative  Airway & Oxygen Therapy: Patient Spontanous Breathing  Post-op Assessment: Report given to RN and Post -op Vital signs reviewed and stable  Post vital signs: Reviewed and stable  Last Vitals:  Vitals Value Taken Time  BP 91/54 11/29/22 1433  Temp 97.7 11/29/22  1435  Pulse 72 11/29/22 1433  Resp 10 11/29/22 1433  SpO2 96 % 11/29/22 1433    Last Pain:  Vitals:   11/29/22 1433  TempSrc:   PainSc: 0-No pain      Patients Stated Pain Goal: 5 (11/29/22 1227)  Complications: No notable events documented.

## 2022-11-29 NOTE — Anesthesia Preprocedure Evaluation (Signed)
Anesthesia Evaluation  Patient identified by MRN, date of birth, ID band Patient awake    Reviewed: Allergy & Precautions, H&P , NPO status , Patient's Chart, lab work & pertinent test results, reviewed documented beta blocker date and time   Airway Mallampati: II  TM Distance: >3 FB Neck ROM: full    Dental no notable dental hx.    Pulmonary neg pulmonary ROS   Pulmonary exam normal breath sounds clear to auscultation       Cardiovascular Exercise Tolerance: Good negative cardio ROS  Rhythm:regular Rate:Normal     Neuro/Psych negative neurological ROS  negative psych ROS   GI/Hepatic negative GI ROS, Neg liver ROS,,,  Endo/Other  negative endocrine ROS    Renal/GU negative Renal ROS  negative genitourinary   Musculoskeletal   Abdominal   Peds  Hematology negative hematology ROS (+)   Anesthesia Other Findings   Reproductive/Obstetrics negative OB ROS                             Anesthesia Physical Anesthesia Plan  ASA: 2  Anesthesia Plan: General   Post-op Pain Management:    Induction:   PONV Risk Score and Plan: Propofol infusion  Airway Management Planned:   Additional Equipment:   Intra-op Plan:   Post-operative Plan:   Informed Consent: I have reviewed the patients History and Physical, chart, labs and discussed the procedure including the risks, benefits and alternatives for the proposed anesthesia with the patient or authorized representative who has indicated his/her understanding and acceptance.     Dental Advisory Given  Plan Discussed with: CRNA  Anesthesia Plan Comments:        Anesthesia Quick Evaluation  

## 2022-11-29 NOTE — Discharge Instructions (Signed)
You are being discharged to home.  Resume your previous diet.  Your physician has recommended a repeat colonoscopy in 10 years for screening purposes.  

## 2022-12-05 ENCOUNTER — Encounter (HOSPITAL_COMMUNITY): Payer: Self-pay | Admitting: Gastroenterology

## 2022-12-06 NOTE — Anesthesia Postprocedure Evaluation (Signed)
Anesthesia Post Note  Patient: Austin Reeves.  Procedure(s) Performed: COLONOSCOPY WITH PROPOFOL  Patient location during evaluation: Phase II Anesthesia Type: General Level of consciousness: awake Pain management: pain level controlled Vital Signs Assessment: post-procedure vital signs reviewed and stable Respiratory status: spontaneous breathing and respiratory function stable Cardiovascular status: blood pressure returned to baseline and stable Postop Assessment: no headache and no apparent nausea or vomiting Anesthetic complications: no Comments: Late entry   No notable events documented.   Last Vitals:  Vitals:   11/29/22 1227 11/29/22 1433  BP: 135/81 (!) 91/54  Pulse: 72 72  Resp: 14 10  Temp: 36.6 C   SpO2: 98% 96%    Last Pain:  Vitals:   11/29/22 1433  TempSrc:   PainSc: 0-No pain                 Windell Norfolk

## 2023-11-12 ENCOUNTER — Encounter (INDEPENDENT_AMBULATORY_CARE_PROVIDER_SITE_OTHER): Payer: Self-pay | Admitting: Gastroenterology

## 2024-01-07 ENCOUNTER — Encounter: Payer: Self-pay | Admitting: Family Medicine

## 2024-01-07 ENCOUNTER — Ambulatory Visit: Admitting: Family Medicine

## 2024-01-07 VITALS — BP 136/76 | HR 60 | Temp 98.1°F | Ht 72.0 in | Wt 204.0 lb

## 2024-01-07 DIAGNOSIS — R7303 Prediabetes: Secondary | ICD-10-CM

## 2024-01-07 DIAGNOSIS — M720 Palmar fascial fibromatosis [Dupuytren]: Secondary | ICD-10-CM | POA: Diagnosis not present

## 2024-01-07 DIAGNOSIS — Z1283 Encounter for screening for malignant neoplasm of skin: Secondary | ICD-10-CM

## 2024-01-07 DIAGNOSIS — Z130 Encounter for screening for diseases of the blood and blood-forming organs and certain disorders involving the immune mechanism: Secondary | ICD-10-CM | POA: Diagnosis not present

## 2024-01-07 DIAGNOSIS — Z23 Encounter for immunization: Secondary | ICD-10-CM

## 2024-01-07 DIAGNOSIS — Z0001 Encounter for general adult medical examination with abnormal findings: Secondary | ICD-10-CM | POA: Diagnosis not present

## 2024-01-07 DIAGNOSIS — E785 Hyperlipidemia, unspecified: Secondary | ICD-10-CM | POA: Diagnosis not present

## 2024-01-07 DIAGNOSIS — Z125 Encounter for screening for malignant neoplasm of prostate: Secondary | ICD-10-CM | POA: Diagnosis not present

## 2024-01-07 DIAGNOSIS — Z0000 Encounter for general adult medical examination without abnormal findings: Secondary | ICD-10-CM

## 2024-01-07 NOTE — Assessment & Plan Note (Signed)
 Overall doing well.  Labs today.  Flu shot given.  Referral placed to dermatology as well as hand surgery.

## 2024-01-07 NOTE — Patient Instructions (Signed)
 Labs today.  Referrals placed.  Follow up annually.  Take care  Dr. Bluford

## 2024-01-07 NOTE — Progress Notes (Signed)
 Subjective:  Patient ID: Austin LELON Kristine Mickey., male    DOB: [birth year]  Age: 63 y.o. MRN: [id number]  CC: Annual exam   HPI:  63 year old male presents for an annual exam.  Patient states that overall he is doing well.  Desires his flu shot today.  Needs labs.  He is amenable to pneumococcal vaccine but we do not have any here today.  Advised that he can get his shingles vaccine at the pharmacy.  Patient has fair skin and wants to see dermatology for routine skin cancer check.  Patient is also ready for referral to hand surgery in regards to Dupuytren's contracture.  Patient Active Problem List   Diagnosis Date Noted   History of colonic polyps 11/29/2022   Annual physical exam 10/06/2022   Hyperlipidemia 10/06/2022   Prediabetes 10/06/2022   Allergic rhinitis 01/25/2021   Dupuytren's contracture of left hand 01/25/2021   History of palpitations 01/25/2021    Social Hx   Social History   Socioeconomic History   Marital status: Married    Spouse name: Santana   Number of children: 3   Years of education: Not on file   Highest education level: High school graduate  Occupational History   Occupation: Solicitor  Tobacco Use   Smoking status: Never   Smokeless tobacco: Never  Vaping Use   Vaping status: Never Used  Substance and Sexual Activity   Alcohol use: Yes    Alcohol/week: 4.0 standard drinks of alcohol    Types: 4 Cans of beer per week    Comment: occasional   Drug use: No   Sexual activity: Yes  Other Topics Concern   Not on file  Social History Narrative   Production Designer, Theatre/television/film for a [employer], 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   Work in [place] , drives 1.5 hours a day.    Married for over 20 years.   Has 2 sons, one graduated college recently  And one is in college.   Eats all foods.   Wear seatbelt.   Exercise by working in yard.    Grew up in [place].    Social Drivers of Corporate Investment Banker Strain: Low Risk  (06/13/2017)    Overall Financial Resource Strain (CARDIA)    Difficulty of Paying Living Expenses: Not hard at all  Food Insecurity: No Food Insecurity (06/13/2017)   Hunger Vital Sign    Worried About Running Out of Food in the Last Year: Never true    Ran Out of Food in the Last Year: Never true  Transportation Needs: No Transportation Needs (06/13/2017)   PRAPARE - Administrator, Civil Service (Medical): No    Lack of Transportation (Non-Medical): No  Physical Activity: Sufficiently Active (06/13/2017)   Exercise Vital Sign    Days of Exercise per Week: 7 days    Minutes of Exercise per Session: 60 min  Stress: No Stress Concern Present (06/13/2017)   [employer] of Occupational Health - Occupational Stress Questionnaire    Feeling of Stress : Only a little  Social Connections: Somewhat Isolated (06/13/2017)   Social Connection and Isolation Panel    Frequency of Communication with Friends and Family: Once a week    Frequency of Social Gatherings with Friends and Family: Once a week    Attends Religious Services: More than 4 times per year    Active Member of [employer] or Organizations: No    Attends Banker Meetings: Never  Marital Status: Married    Review of Systems Per HPI  Objective:  BP 136/76   Pulse 60   Temp 98.1 F (36.7 C)   Ht 6' (1.829 m)   Wt 204 lb (92.5 kg)   SpO2 97%   BMI 27.67 kg/m      01/07/2024    9:04 AM 01/07/2024    8:28 AM 11/29/2022    2:33 PM  BP/Weight  Systolic BP 136 151 91  Diastolic BP 76 72 54  Wt. (Lbs)  204   BMI  27.67 kg/m2     Physical Exam Vitals and nursing note reviewed.  Constitutional:      General: He is not in acute distress.    Appearance: Normal appearance.  HENT:     Head: Normocephalic and atraumatic.     Mouth/Throat:     Pharynx: Oropharynx is clear.  Eyes:     General:        Right eye: No discharge.        Left eye: No discharge.     Conjunctiva/sclera: Conjunctivae normal.   Cardiovascular:     Rate and Rhythm: Normal rate and regular rhythm.  Pulmonary:     Effort: Pulmonary effort is normal.     Breath sounds: Normal breath sounds. No wheezing, rhonchi or rales.  Musculoskeletal:     Comments: Left hand -Dupuytren's contracture noted  Neurological:     Mental Status: He is alert.  Psychiatric:        Mood and Affect: Mood normal.        Behavior: Behavior normal.     Lab Results  Component Value Date   WBC 6.3 09/27/2022   HGB 14.8 09/27/2022   HCT 43.2 09/27/2022   PLT 223 09/27/2022   GLUCOSE 114 (H) 09/27/2022   CHOL 220 (H) 09/27/2022   TRIG 115 09/27/2022   HDL 56 09/27/2022   LDLCALC 144 (H) 09/27/2022   ALT 14 09/27/2022   AST 13 09/27/2022   NA 138 09/27/2022   K 5.3 (H) 09/27/2022   CL 103 09/27/2022   CREATININE 0.90 09/27/2022   BUN 18 09/27/2022   CO2 22 09/27/2022   HGBA1C 5.9 (H) 09/27/2022     Assessment & Plan:  Annual physical exam Assessment & Plan: Overall doing well.  Labs today.  Flu shot given.  Referral placed to dermatology as well as hand surgery.   Prediabetes -     CMP14+EGFR -     Hemoglobin A1c -     Microalbumin / creatinine urine ratio  Hyperlipidemia, unspecified hyperlipidemia type -     Lipid panel  Screening for deficiency anemia -     CBC  Encounter for screening prostate specific antigen (PSA) measurement -     PSA  Skin cancer screening -     Ambulatory referral to Dermatology  Dupuytren's contracture of left hand -     Ambulatory referral to Hand Surgery  Immunization due -     Flu vaccine trivalent PF, 6mos and older(Flulaval,Afluria,Fluarix,Fluzone)    Follow-up: Annually  Jacqulyn Ahle DO [facility]

## 2024-01-08 ENCOUNTER — Ambulatory Visit: Payer: Self-pay | Admitting: Family Medicine

## 2024-01-08 LAB — CMP14+EGFR
ALT: 17 IU/L (ref 0–44)
AST: 18 IU/L (ref 0–40)
Albumin: 4.6 g/dL (ref 3.9–4.9)
Alkaline Phosphatase: 85 IU/L (ref 47–123)
BUN/Creatinine Ratio: 15 (ref 10–24)
BUN: 14 mg/dL (ref 8–27)
Bilirubin Total: 0.6 mg/dL (ref 0.0–1.2)
CO2: 23 mmol/L (ref 20–29)
Calcium: 9.7 mg/dL (ref 8.6–10.2)
Chloride: 102 mmol/L (ref 96–106)
Creatinine, Ser: 0.92 mg/dL (ref 0.76–1.27)
Globulin, Total: 2 g/dL (ref 1.5–4.5)
Glucose: 117 mg/dL — ABNORMAL HIGH (ref 70–99)
Potassium: 5.3 mmol/L — ABNORMAL HIGH (ref 3.5–5.2)
Sodium: 137 mmol/L (ref 134–144)
Total Protein: 6.6 g/dL (ref 6.0–8.5)
eGFR: 94 mL/min/1.73 (ref >59)

## 2024-01-08 LAB — MICROALBUMIN / CREATININE URINE RATIO
Creatinine, Urine: 96.6 mg/dL
Microalb/Creat Ratio: 3 mg/g{creat} (ref 0–29)
Microalbumin, Urine: 3.1 ug/mL

## 2024-01-08 LAB — CBC
Hematocrit: 43 % (ref 37.5–51.0)
Hemoglobin: 14.3 g/dL (ref 13.0–17.7)
MCH: 30.7 pg (ref 26.6–33.0)
MCHC: 33.3 g/dL (ref 31.5–35.7)
MCV: 92 fL (ref 79–97)
Platelets: 224 x10E3/uL (ref 150–450)
RBC: 4.66 x10E6/uL (ref 4.14–5.80)
RDW: 12.7 % (ref 11.6–15.4)
WBC: 7.3 x10E3/uL (ref 3.4–10.8)

## 2024-01-08 LAB — HEMOGLOBIN A1C
Est. average glucose Bld gHb Est-mCnc: 126 mg/dL
Hgb A1c MFr Bld: 6 % — ABNORMAL HIGH (ref 4.8–5.6)

## 2024-01-08 LAB — LIPID PANEL
Chol/HDL Ratio: 3.6 ratio (ref 0.0–5.0)
Cholesterol, Total: 230 mg/dL — ABNORMAL HIGH (ref 100–199)
HDL: 64 mg/dL (ref >39)
LDL Chol Calc (NIH): 150 mg/dL — ABNORMAL HIGH (ref 0–99)
Triglycerides: 94 mg/dL (ref 0–149)
VLDL Cholesterol Cal: 16 mg/dL (ref 5–40)

## 2024-01-08 LAB — PSA: Prostate Specific Ag, Serum: 0.4 ng/mL (ref 0.0–4.0)
